# Patient Record
Sex: Male | Born: 1945 | Race: White | Hispanic: No | Marital: Married | Smoking: Never smoker
Health system: Southern US, Community
[De-identification: ages and names within clinical notes are randomized; demographics above are authoritative.]

## PROBLEM LIST (undated history)

## (undated) DIAGNOSIS — E785 Hyperlipidemia, unspecified: Secondary | ICD-10-CM

## (undated) DIAGNOSIS — G8929 Other chronic pain: Secondary | ICD-10-CM

## (undated) DIAGNOSIS — I10 Essential (primary) hypertension: Secondary | ICD-10-CM

## (undated) HISTORY — PX: BACK SURGERY: SHX140

## (undated) HISTORY — PX: CARDIAC CATHETERIZATION: SHX172

---

## 1998-03-10 ENCOUNTER — Ambulatory Visit (HOSPITAL_COMMUNITY): Admission: RE | Admit: 1998-03-10 | Discharge: 1998-03-10 | Payer: Self-pay | Admitting: Urology

## 2000-03-10 ENCOUNTER — Inpatient Hospital Stay (HOSPITAL_COMMUNITY): Admission: EM | Admit: 2000-03-10 | Discharge: 2000-03-13 | Payer: Self-pay | Admitting: Emergency Medicine

## 2000-03-10 ENCOUNTER — Encounter: Payer: Self-pay | Admitting: Orthopedic Surgery

## 2000-03-10 ENCOUNTER — Encounter: Payer: Self-pay | Admitting: Emergency Medicine

## 2000-06-23 ENCOUNTER — Encounter: Payer: Self-pay | Admitting: Specialist

## 2000-06-28 ENCOUNTER — Encounter (INDEPENDENT_AMBULATORY_CARE_PROVIDER_SITE_OTHER): Payer: Self-pay | Admitting: *Deleted

## 2000-06-28 ENCOUNTER — Inpatient Hospital Stay (HOSPITAL_COMMUNITY): Admission: RE | Admit: 2000-06-28 | Discharge: 2000-07-03 | Payer: Self-pay | Admitting: Specialist

## 2000-06-28 ENCOUNTER — Encounter: Payer: Self-pay | Admitting: Specialist

## 2004-10-16 ENCOUNTER — Emergency Department (HOSPITAL_COMMUNITY): Admission: EM | Admit: 2004-10-16 | Discharge: 2004-10-16 | Payer: Self-pay | Admitting: Emergency Medicine

## 2008-03-21 ENCOUNTER — Emergency Department (HOSPITAL_COMMUNITY): Admission: EM | Admit: 2008-03-21 | Discharge: 2008-03-21 | Payer: Self-pay | Admitting: Emergency Medicine

## 2008-04-05 ENCOUNTER — Ambulatory Visit (HOSPITAL_COMMUNITY): Admission: RE | Admit: 2008-04-05 | Discharge: 2008-04-05 | Payer: Self-pay | Admitting: Orthopedic Surgery

## 2008-04-13 ENCOUNTER — Ambulatory Visit (HOSPITAL_COMMUNITY): Admission: RE | Admit: 2008-04-13 | Discharge: 2008-04-13 | Payer: Self-pay | Admitting: Orthopedic Surgery

## 2010-05-05 ENCOUNTER — Ambulatory Visit
Admission: RE | Admit: 2010-05-05 | Discharge: 2010-05-05 | Payer: Self-pay | Source: Home / Self Care | Attending: Urology | Admitting: Urology

## 2010-07-13 LAB — POCT HEMOGLOBIN-HEMACUE: Hemoglobin: 14.1 g/dL (ref 13.0–17.0)

## 2010-09-18 NOTE — Discharge Summary (Signed)
[place]  Patient:    Roger [name], Roger [name]                    MRN: [id number] Adm. Date:  [id number] Disc. Date: [id number] Attending:  Aldean Baker V                           Discharge Summary  DIAGNOSIS:  Low back pain.  PROCEDURES:  None.  TREATMENT:  Pain control.  DISPOSITION:  The patient was discharged to home in stable condition.  LABORATORY STUDIES:  Plain radiographs and MRI scan.  MRI scan findings negative for recurrent HNP.  HISTORY OF PRESENT ILLNESS:  The patient is a 65 year old gentleman who had an acute onset of pain after picking up a box on March 09, 2000.  He is status post a lumbar spine discectomy x 2 and cervical spine anterior cervical discectomy and fusion x 1.  The patient, on initial evaluation, had a normal physical examination with positive sciatic stretch test.  CT scan showed what was felt to be an HNP at L4-5.  HOSPITAL COURSE:  He was admitted with pain control medication.  The patient underwent an MRI scan which did not show a ruptured disc.  The patient was feeling better on March 11, 2000.  Physical examination was within normal limits. He was discharged on March 12, 2000, in stable condition.  All motor strength was intact.  FOLLOW-UP:  He will follow up in the office in one to two weeks. DD:  03/29/00 TD:  03/29/00 Job: [id number]

## 2010-09-18 NOTE — Discharge Summary (Signed)
[place]. [facility]  Patient:    Roger [name], Roger [name]                    MRN: [id number] Adm. Date:  [id number] Disc. Date: [id number] Attending:  Lubertha South Dictator:   Ralene Bathe, P.A.                           Discharge Summary  ADMISSION DIAGNOSES: 1. 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 2. Hypercholesterolemia.  DISCHARGE DIAGNOSES: 1. 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 2. Hypercholesterolemia. 3. Status post posterolateral inner disc fusion at L4-L5 and microdiskectomy    with Allograft and local bone graft at right iliac crest utilizing rod    instrumentation and pedicle screws. 4. Intraoperative anemia utilizing cell saver for two units of autologous    blood donation.  PROCEDURE:  Posterior lumbar inner body fusion L4-L5 with 2 x 13 mm Daul Allograft and local bone graft with posterolateral fusion at L4-L5 with right iliac bone graft harvest with Synthes pedicle screws and rods system.  Surgeon Dr. Otelia Sergeant and assistant Dr. Simonne Come under general anesthesia.  HISTORY OF PRESENT ILLNESS:  Roger [name] is a well-known patient to our practice.  He has been followed some time for his back problems.  He has had two previous lumbar laminectomy surgeries by Dr. Wyonia Hough reportedly injured his back in November 2001, from lifting.  He had recurrent disc protrusion at anterior foramen at left side at L4-L5 and foraminal stenosis with severe degenerative disc changes at this level.  He also had apparent disc degeneration at L5-S1 which was minimal.  There was no evidence of nerve compression at this level.  With these indications and his physical findings, he is felt to require one-level posterolateral inner body fusion  with microdiskectomy.  Risks and benefits have been discussed with the patient.  He is in agreement and wishes to proceed.  HOSPITAL COURSE:  The patient was admitted and underwent above-mentioned procedure and tolerated this well.  All appropriate antibiotics and analgesics were provided.  He did receive two units of autologous blood intraoperatively from cell saver.  Postoperatively, the patient was placed on PCA for pain control as well as OxyContin and sustained release and intermediate release. He was also placed on bowel precautions.  Postoperatively, the patient had a significant amount of pain control as suspected with this surgery.  He was able to be weaned to p.o. analgesics.  His bowel function was slow to resume, however, he did begin passing flatus and was able to advance to a normal diet. By July 03, 2000, he was doing much better.  His pain was controlled.  He was ambulating distances greater than 100 feet in the hall.  He was tolerating his brace and able to doff and don without difficulty.  He was neurovascularly intact.  At this time, he was felt medically and orthopedically stable for discharge.  His most recent hemoglobin was 9.5 and stable.  He had had a bowel movement and was voiding without difficulty.  Temperatures were afebrile.  On July 03, 2000, he was stable for discharge to home.  LABORATORY DATA AND X-RAY FINDINGS:  Intraoperative and postoperative lumbar films show posterolateral fusion at L4-L5.  Hemoglobin on admission 10.4, he was 7.0 intraoperatively and postoperatively stable at 10.9, 10.0 and 9.5. Coagulation time normal on admission.  Routine chemistries on admission and postoperatively normal.  Urinalysis normal.  Chest x-ray not found at the time of dictation, nor EKG.  CONDITION ON DISCHARGE:  Stable and improved.  DISPOSITION:  Discharged to home.  FOLLOWUP:  He will follow up in our office in about seven days.  Call for a time.  DISCHARGE  MEDICATIONS: 1. OxyContin SR 20 mg b.i.d., #14. 2. OxyContin IR 5 mg, #41, q.4-6h. p.r.n. breakthrough pain. 3. Robaxin 500 mg, #31, every eight hours p.r.n. spasm, one refill. 4. Resume home medications.  DIET:  Resume home diet.  SPECIAL INSTRUCTIONS:  Wear brace when out of bed.  May shower in two to three days.  WOUND CARE:  Keep incision clean and dry. DD:  07/27/00 TD:  07/28/00 Job: [id number]

## 2010-09-18 NOTE — Op Note (Signed)
[place]. [facility]  Patient:    Roger [name], Roger [name]                    MRN: [id number] Proc. Date: 06/28/00 Adm. Date:  [id number] Attending:  Lubertha South                           Operative Report  PREOPERATIVE DIAGNOSES: 1. Lateral recurrent disk protrusion left L4-5. (This is this patients third    recurrent disk protrusion at this segment). 2. Minimal degenerative disk disease at L5-S1.  POSTOPERATIVE DIAGNOSES: 1. Lateral recurrent disk protrusion left L4-5. (This is this patients third    recurrent disk protrusion at this segment). 2. Minimal degenerative disk disease at L5-S1. 3. 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  OPERATIVE PROCEDURE: 1. Posterior lumbar interbody fusion technique using a Dall    allograft two 13 mm x 4 mm grafts and local bone graft to    the inner space. 2. Posterolateral fusion L4-5 with right iliac crest bone graft    harvested through separate fascial incision. 3. Synthes click pedicle screw and rod system using 85 mm prebent    rod divided into two portions and four 40 mm x 7 mm screws.  SURGEON:  [name], M.D.  ASSISTANT:  Illene Labrador. [place], M.D.  ANESTHESIA:  General endotracheal by [name]. [name], M.D.  ESTIMATED BLOOD LOSS:  800 cc.  DRAINS/TUBES:  Hemovac x 1, Foley catheter to straight drain.  INTRAVENOUS FLUIDS:  2 units of autogenous blood in operating room.  SPECIMENS:  Surgical specimen was disk material for gross specimen only.  INDICATIONS:  This patient is a 65 year old male. He has undergone two pervious lumbar laminectomy surgeries by Dr. [name]. [place] in the past. He reportedly injured his back in November of this past year lifting. Studies have indicated recurrent disk protrusion into the foramen on the left side at L4-5 with foraminal stenosis and severe degenerative disk  changes at this segment. He also has apparent disk degeneration at L5-S1 which is minimal. No evidence of nerve compression at the L5-S1 level, severe foraminal entrapment along the left side at L4-5 by apparent disk material and some spondylosis changes.  INTRAOPERATIVE FINDINGS:  The patient was found to have severe entrapment of the left L4 nerve root related to spur off the posterior aspect of the disk space at L4-5 as well as disk space collapse causing severe foraminal narrowing and overlying spondylosis of the left L4-5 facet. Disk herniation was also present into the left lateral recess effecting both the L4 and left L5 nerve root.  DESCRIPTION OF PROCEDURE:  After adequate general anesthesia with the patient in a prone position, all pressure points were well padded, chest rolls were used.  He was given standard preoperative antibiotics. He was draped in the usual manner following prep with DuraPrep solution, and after draping in the usual manner, an Ioban impregnated Vi-Drape was placed.  The incision ellipsing the old incision scar extending from S1 to L3 in the midline. The incision brought sharply through skin and subcutaneous layers after infiltration with Marcaine 0.50% with 1:200,000 epinephrine, approximately 20 cc were used. The patients lumbodorsal fascia was then incised on both sides of the spinous processes extending from L3 to S1. Three Cobbs were  then used to elevate the paralumbar muscles off of the posterior elements bilaterally at the L3, L4 and L5 levels. Clamps were placed over the spinous process of L4 and L5 and intraoperative radiograph demonstrated the clamps on L4 and L5. McCullough retractors were inserted; bleeders controlled using electrocautery. The spinous process of L4 and L5 were marked appropriately for continued identification. A Leksell rongeur was then used to excise the spinous process of L4 and the very superior aspect spinous process of  L5; this was resected down to the base of the posterior aspect of the lamina and the lamina was then thinned using Leksell rongeur. A 3 mm Kerrison was then introduced in the midline underneath the lamina of L4 and used to perform a central laminectomy of L4 removing the lamina bilaterally, preserving the pars region, and performing decompression of the L4 nerve roots bilaterally and L5 nerve roots bilaterally with foraminotomy of the L5 nerve roots. The medial aspect of the facet on the right side was resected over 50% and on the left almost 75% of the facet was resected in order to decompress the L4 nerve root. Excising the reflected portion of the ligamentum flavum, the nerve root was quite pinched, associated with a spur off the posterior aspect of the disk space at this level on the left side. Carefully, the thecal sac and left L4 nerve root and L5 nerve root were freed up using Penfield #4, carefully mobilizing the sac medially, dividing scar tissue within the axillary portion of the left L4 nerve root, and excising hypertrophic ligamentum flavum that had attached itself here. Bipolar electrocautery was used to control small bleeders and small veins.  The disk space on the left side was carefully freed up. The #15 blade scalpel was used to incise the disk carefully protecting the thecal sac and L4 nerve root with DEerrico retractor. After incising the disk, the disk space was found to be narrow, the end plates were debrided posteriorly using an end-cutting osteophyte rongeur. First a narrow pituitary and then a pituitary with teeth was able to be introduced into the intervertebral disk space and the disk space debrided of disk along the left side, removing all of the disk on this left side as much as possible as this was a generous debridement.  When this was completed and the thecal sac and nerve root freed up enough to allow for a Dall graft to be placed, the right sided portion of  the central laminectomy was then carefully exposed. A foraminotomy performed over the right L5 nerve root and over the right L4 nerve root. The thecal sac was then  mobilized medially to the midline so that the disk was exposed on the right side; bleeders controlled using bipolar electrocautery. A #15 blade scalpel was then used incise and excise a window of disk material on the right side posteriorly so that a pituitary rongeur could be inserted and the disk excised at the L4-5 level on the right side, as well as that on the left. With as much disk material as could be removed was removed, a sound was introduced into the right side. Using a 9 mm sound, the disk space was dilated, first to 9 mm, then 11 mm and then 13 mm on the right. A 13 mm graft appeared to give the best fit so this was chosen. A 13 mm x 4 mm Dall graft was brought onto the field to be sewn in place. The end plates were then carefully debrided  of cartilaginous material using the wooden handle and long curet, as well as the box osteotome, debriding away end plate of cartilaginous material and disk attachment. When all this had been debrided as much as possible on the right side, it was also carried out on the left side. Note, that retraction was only on one side at a time with care taken not to over-retract the thecal sac to the opposite level. With the disk space prepared, the left side was also sounded up to a 13 mm graft. The sounds were removed. The 13 mm x 4 mm Dall graft was introduced on the right side and impacted into place under C-arm fluoroscopy and observed to be in excellent position and alignment. This was counter sunk approximately 4-5 mm on the right side and on the left side approximately 3-4 mm.  Irrigation was performed. The graft was then impacted on the left side when it was ensured that there was no remaining disk material within the vertebral disk space. Once the graft was impacted on the left  side, bone had been harvested from the posterior elements of the facets and lamina. It was then placed in the space between the two Dall grafts centrally and impacted into place to provide for bone grafting between the allograft and bone graft sites. When this was completed, then careful inspection was made of the nerve roots. A hockey nerve probe could be passed out the neuroforamen of L4 bilaterally and L5 bilaterally demonstrating no further compression of the nerve roots here. Additionally, a hockey stick was passed over the posterior aspect of the intervertebral disk space and demonstrated no bone graft within the spinal canal or retropulse within the canal causing any canal compromise. With this then attention was turned to placement of pedicle screws. The posterior and lateral gutters were then developed over the transverse process of L4 and L5. Using cautery in the soft tissue over the lateral aspect of the facet at L3-4 identifying the L4 transverse process and then carefully stripping it of soft tissue attachment and debriding muscle where appropriate. Bleeders controlled using electrocautery. The facet at the L3-4 level was preserved. At the L4-5 level the transverse process was identified bilaterally. On the right side, the facet had overgrown laterally, quite a bit overlapping the transverse process and required resection of a portion of the lateral aspect of the superior articular process. This allowed for exposure of the transverse process here. The same was done on the left side. Following this then, the transverse process of L4 and L5 bilaterally were completely exposed along with the lateral aspect of the superior articular process of L4 and L5. Bleeders were controlled using electrocautery. The entry point for the pedicle on the left side at L4-5 was first performed using an awl into the midportion intersection of the transverse process of the superior articular process.  This was at the base of the superior articular process at its intersection with the transverse process. The larger pedicle finder was introduced and used to find the pedicle into the vertebral body. C-arm was used to ascertain the correct position and alignment of this penetration of the pedicle. Tapping was then performed using a 6.25 tap after measuring the appropriate length for the screw at 40 mm. A ball-tipped sounder was used to probe the pedicle hole and demonstrated no evidence of fracture of the pedicle, medial, lateral superior or inferior. The 7.0 screw was then introduced and screwed into place. C-arm fluoroscopy was used and was observed  to be well placed within the pedicle on the lateral view. Convergence was approximately 20-25 degrees. Similarly, a lead screw was then placed on the left side at the L5 level. This again was done using the awl and then the pedicle finder gear shift, and then gauging the length at 40 mm using a ball-tipped sounder to ensure that the pedicle was not breached, then tapping the 6.25 and then applying the appropriate 40 mm screw into this pedicle. Both levels obtained excellent purchase. C-arm fluoroscopy demonstrated the screws to be in good position and alignment. A counter sink was then used to clear the bone over the screwheads bilaterally on the left side and right side and then with placement of the right sided screws. Similarly, screws were placed on the right side at L4 and at L5 converging each at about 20 degrees and placing them using C-arm fluoroscopy, sounding each of them appropriately with the ball-tipped sounder, tapping with 6.5 tap and then placing the 7.0 screw at 40 mm each. Prebent 85 mm rod was used to affix the levels; this was done after decorticating the transverse process at L4 and L5 bilaterally. Bone graft was harvested from the right iliac crest through a separate fascial incision at this point, incising between the  fatty planes in the right lumbodorsal fascia out to the right iliac crest and the posterior superior iliac spine. Incision then made onto the posterior iliac crest and the posterior superior iliac spine and extended laterally. Cobbs were then used to elevate the periosteum off the medial and lateral aspect of the iliac crest posteriorly and self-retaining retractors inserted. Osteotomes were then used to remove corticocancellous strips of bone  graft and gouges used to remove inner corticocancellous strips of bone graft, enough for a single-level fusion. Bone graft harvest site was then irrigated and bone wax applied to the bleeding cancellous bone surfaces, then Gelfoam and then closed using interrupted #1 Vicryl sutures approximating the lumbodorsal fascia and the gluteal fascia over the defect here. Bone graft was then placed out over the transverse processes extending from L4 to L5, cancellous bone graft within the inner space and also both transverse processes superiorly and inferiorly. After this was placed the corticocancellous strips were then placed bridging the transverse process of L4 to L5 bilaterally.  When this was completed, each of the click-on fasteners were then placed over the screwheads at each segment and these were placed without difficulty. Rods were then placed and the caps then screwed into place using the counter torque device appropriately over all four levels. The most superior screws were then tightened, again using the counter torque device on both left and right sides. Using the compressor then, compression was obtained between the two pedicle screws on the left side at L4-5 and the lower screw then tightened into place. A cap was then tightened in fastening the rod to the pedicle screw. Similarly using compression device, then the right side pedicle screw was connected to the rod. This obtained compression across the graft anteriorly with pedicle screws. Care  was taken then to insert a hockey stick probe to ensure that the patient had adequate decompression of both L4 and L5 nerve roots continued.  Irrigation was performed with care taken not to remove bone graft. Excess Gelfoam was then removed and thrombin-soaked Gelfoam placed over the laminotomy defect posteriorly. A Hemovac drain exiting out the left lower back through the soft tissues placed here about the S1 level. The lumbodorsal fascia was then  reapproximated in the midline after a single suture approximated the soft tissues at the L4 level. The lumbodorsal fascia was approximated in the midline with interrupted figure-of-eight simple sutures of #1 Vicryl, the deep subcutaneous layers approximated with interrupted #1 and #0 Vicryl sutures, the more superficial layers with interrupted 2-0 Vicryl sutures and the skin closed with a running stitch of 4-0 Monocryl. Tincture of Benzoin and Steri-Strips was applied, 4 x 4s, and ABD pad affixed to the skin with Hypafix tape. The patient was returned to the supine position, extubated, reactivated and returned to the recovery room in satisfactory condition.  All sponge, needle, lap and instrument counts were correct. DD:  06/28/00 TD:  06/29/00 Job: [id number]

## 2011-02-02 ENCOUNTER — Ambulatory Visit (HOSPITAL_BASED_OUTPATIENT_CLINIC_OR_DEPARTMENT_OTHER)
Admission: RE | Admit: 2011-02-02 | Discharge: 2011-02-02 | Disposition: A | Discharge status: Home / Self Care | Payer: Medicare Other | Source: Ambulatory Visit | Attending: Urology | Admitting: Urology

## 2011-02-02 ENCOUNTER — Ambulatory Visit (HOSPITAL_BASED_OUTPATIENT_CLINIC_OR_DEPARTMENT_OTHER): Admission: RE | Admit: 2011-02-02 | Payer: Medicare Other | Source: Ambulatory Visit | Admitting: Urology

## 2011-02-02 DIAGNOSIS — L940 Localized scleroderma [morphea]: Secondary | ICD-10-CM | POA: Insufficient documentation

## 2011-02-02 DIAGNOSIS — N3289 Other specified disorders of bladder: Secondary | ICD-10-CM | POA: Insufficient documentation

## 2011-02-02 DIAGNOSIS — I10 Essential (primary) hypertension: Secondary | ICD-10-CM | POA: Insufficient documentation

## 2011-02-02 DIAGNOSIS — N35919 Unspecified urethral stricture, male, unspecified site: Secondary | ICD-10-CM | POA: Insufficient documentation

## 2011-02-05 NOTE — Op Note (Signed)
  NAME:  [name], Roger NO.:  [id number]  MEDICAL RECORD NO.:  [id number]  LOCATION:                               FACILITY:  [facility]  PHYSICIAN:  Natalia Leatherwood, MD    DATE OF BIRTH:  15-May-1945  DATE OF PROCEDURE:  02/02/2011 DATE OF DISCHARGE:                              OPERATIVE REPORT   SURGEON:  Natalia Leatherwood, MD.  PREOPERATIVE DIAGNOSIS:  Lichen sclerosus with meatal stenosis.  POSTOPERATIVE DIAGNOSIS:  Lichen sclerosus with meatal stenosis.  PROCEDURE PERFORMED:  Cystourethroscopy with meatal dilation and Foley catheter placement.  FINDINGS:  Meatal stenosis.  No strictures throughout the rest of the urethra.  Trabeculated bladder.  BLOOD LOSS:  None.  COMPLICATIONS:  None.  DRAINS:  Foley catheter.  SPECIMEN:  None.  HISTORY OF PRESENT ILLNESS:  This is a 65 year old male who has a longstanding problem with Lichen sclerosus and meatal stenosis.  He presented to my clinic with an almost pinpoint urethral meatus with difficulty voiding.  We discussed options and I recommended dilation in the operating room, which the patient agreed with.  I did explain to the patient prior to going to the operating room that this is a temporary procedure and that the scar will likely return.  The patient voiced understanding of this.  We had discussed the risks and benefits of the procedure.  PROCEDURE:  After informed consent was obtained, the patient was taken to the operating room where he was placed in supine position, IV antibiotics were infused, and general anesthesia was induced.  He was then placed in a dorsal lithotomy position where his genitals were prepped and draped in usual sterile fashion.  Following this, a [employer] sounds were used with water-based lubricant to dilate from beginning with 8-French up to 26-French at the urethral meatus.  After this was done, a rigid cystoscope was passed through the urethra and into the bladder.  The  bladder was noted to be trabeculated.  There were no strictures throughout the rest of the urethra.  All of the scar tissue was at the tip of the urethra.  After this, dilation was carried on up to 36-French with a [employer] sounds.  Following that, 10 cc of sterile lidocaine jelly were placed into the urethra and then a 26- French Foley catheter was placed into the bladder with 10 cc sterile water placed into the balloon.  Bacitracin antibiotic ointment was placed around the tip of the penis.  This concluded the procedure.  He was placed back in a supine position.  Anesthesia was reversed and he was taken to PACU in stable condition.  He will follow up with me and remove his catheter in approximately 10 days.          ______________________________ Natalia Leatherwood, MD     DW/MEDQ  D:  02/02/2011  T:  02/02/2011  Job:  [id number]  Electronically Signed by Natalia Leatherwood MD on 02/05/2011 08:44:02 PM

## 2011-02-15 DIAGNOSIS — R195 Other fecal abnormalities: Secondary | ICD-10-CM | POA: Insufficient documentation

## 2011-08-06 DIAGNOSIS — R030 Elevated blood-pressure reading, without diagnosis of hypertension: Secondary | ICD-10-CM | POA: Insufficient documentation

## 2012-04-17 ENCOUNTER — Emergency Department (HOSPITAL_COMMUNITY): Payer: Medicare Other

## 2012-04-17 ENCOUNTER — Encounter (HOSPITAL_COMMUNITY): Payer: Self-pay | Admitting: Emergency Medicine

## 2012-04-17 ENCOUNTER — Observation Stay (HOSPITAL_COMMUNITY): Payer: Medicare Other

## 2012-04-17 ENCOUNTER — Observation Stay (HOSPITAL_COMMUNITY)
Admission: EM | Admit: 2012-04-17 | Discharge: 2012-04-19 | Disposition: A | Discharge status: Home / Self Care | Payer: Medicare Other | Attending: Orthopedic Surgery | Admitting: Orthopedic Surgery

## 2012-04-17 DIAGNOSIS — S92009A Unspecified fracture of unspecified calcaneus, initial encounter for closed fracture: Principal | ICD-10-CM | POA: Insufficient documentation

## 2012-04-17 DIAGNOSIS — W11XXXA Fall on and from ladder, initial encounter: Secondary | ICD-10-CM | POA: Insufficient documentation

## 2012-04-17 MED ORDER — HYDROMORPHONE HCL PF 1 MG/ML IJ SOLN
1.0000 mg | Freq: Once | INTRAMUSCULAR | Status: AC
  Administered 2012-04-17: 1 mg via INTRAVENOUS
  Filled 2012-04-17: qty 1

## 2012-04-17 MED ORDER — SODIUM CHLORIDE 0.9 % IV SOLN
INTRAVENOUS | Status: DC
  Administered 2012-04-18: 20 mL/h via INTRAVENOUS

## 2012-04-17 MED ORDER — METHOCARBAMOL 500 MG PO TABS
500.0000 mg | ORAL_TABLET | Freq: Four times a day (QID) | ORAL | Status: DC | PRN
  Administered 2012-04-18 – 2012-04-19 (×2): 500 mg via ORAL
  Filled 2012-04-17 (×3): qty 1

## 2012-04-17 MED ORDER — MORPHINE SULFATE 4 MG/ML IJ SOLN
4.0000 mg | INTRAMUSCULAR | Status: DC | PRN
  Administered 2012-04-17 – 2012-04-18 (×3): 4 mg via INTRAVENOUS
  Filled 2012-04-17 (×3): qty 1

## 2012-04-17 MED ORDER — DOCUSATE SODIUM 100 MG PO CAPS
100.0000 mg | ORAL_CAPSULE | Freq: Two times a day (BID) | ORAL | Status: DC
  Administered 2012-04-17 – 2012-04-19 (×4): 100 mg via ORAL
  Filled 2012-04-17 (×5): qty 1

## 2012-04-17 MED ORDER — HYDROCODONE-ACETAMINOPHEN 5-325 MG PO TABS
2.0000 | ORAL_TABLET | Freq: Once | ORAL | Status: AC
  Administered 2012-04-17: 2 via ORAL
  Filled 2012-04-17: qty 2

## 2012-04-17 MED ORDER — METHOCARBAMOL 100 MG/ML IJ SOLN
500.0000 mg | Freq: Four times a day (QID) | INTRAVENOUS | Status: DC | PRN
  Filled 2012-04-17: qty 5

## 2012-04-17 MED ORDER — HYDROCODONE-ACETAMINOPHEN 5-325 MG PO TABS
2.0000 | ORAL_TABLET | Freq: Four times a day (QID) | ORAL | Status: DC | PRN
  Administered 2012-04-17 – 2012-04-18 (×3): 2 via ORAL
  Filled 2012-04-17 (×3): qty 2

## 2012-04-17 NOTE — ED Notes (Signed)
Patient transported to X-ray 

## 2012-04-17 NOTE — ED Provider Notes (Signed)
History     CSN: [id number]  Arrival date & time 04/17/12  1209   First MD Initiated Contact with Patient 04/17/12 1251      No chief complaint on file.  Chief complaint right ankle and foot pain (Consider location/radiation/quality/duration/timing/severity/associated sxs/prior treatment) HPI Patient fell off a ladder from a height approximately 7 feet at 10 AM today injuring his right foot and ankle no other injury no treatment prior to coming here pain is worse with weightbearing or palpation. Improved with remaining still History reviewed. No pertinent past medical history. Medical history is negative Past Surgical History  Procedure Date  . Back surgery     No family history on file.  History  Substance Use Topics  . Smoking status: Never Smoker   . Smokeless tobacco: Not on file  . Alcohol Use: No      Review of Systems  Constitutional: Negative.   HENT: Negative.   Respiratory: Negative.   Cardiovascular: Negative.   Gastrointestinal: Negative.   Musculoskeletal: Positive for arthralgias.  Skin: Negative.   Neurological: Negative.   Hematological: Negative.   Psychiatric/Behavioral: Negative.   All other systems reviewed and are negative.    Allergies  Review of patient's allergies indicates not on file.  Home Medications   Current Outpatient Rx  Name  Route  Sig  Dispense  Refill  . IBUPROFEN 200 MG PO TABS   Oral   Take 600 mg by mouth every 6 (six) hours as needed. For pain.           BP 145/85  Pulse 78  Temp 97.9 F (36.6 C) (Oral)  Resp 22  SpO2 100%  Physical Exam  Nursing note and vitals reviewed. Constitutional: He appears well-developed and well-nourished.  HENT:  Head: Normocephalic and atraumatic.  Eyes: Conjunctivae normal are normal. Pupils are equal, round, and reactive to light.  Neck: Neck supple. No tracheal deviation present. No thyromegaly present.  Cardiovascular: Normal rate and regular rhythm.   No murmur  heard. Pulmonary/Chest: Effort normal and breath sounds normal.  Abdominal: Soft. Bowel sounds are normal. He exhibits no distension. There is no tenderness.  Musculoskeletal: Normal range of motion. He exhibits no edema and no tenderness.       Extremity swollen and tender at lateral ankle and lateral aspect of. Skin intact nontender at the proximal fibula DP pulse 2+ all other extremities no contusion abrasion or tenderness neurovascularly intact  Neurological: He is alert. Coordination normal.  Skin: Skin is warm and dry. No rash noted.  Psychiatric: He has a normal mood and affect.    ED Course  Procedures (including critical care time)  Labs Reviewed - No data to display No results found.   No diagnosis found. 2 PM pain improved intravenous hydromorphone ordered. CT scan of foot ordered. 3:10 PM pain improved after treatment with intravenous hydromorphone MDM  CDU pending CT result Diagnosis #1 closed fracture of right calcaneus #2 closed fracture right #3 fall        Doug Sou, MD 04/21/12 [id number]

## 2012-04-17 NOTE — ED Notes (Signed)
Ortho tech informed of short leg splint

## 2012-04-17 NOTE — ED Notes (Signed)
Pt to go to CDU 6 after CT.

## 2012-04-17 NOTE — Progress Notes (Signed)
XRAYS of the tibia and the knee reviewed and are negative for acute bony or significant ligamentous injury.

## 2012-04-17 NOTE — ED Notes (Signed)
Pt was on a ladder which slipped causing him to fall. Fell approximately 4-5 feet. Right ankle with deformity.

## 2012-04-17 NOTE — ED Notes (Signed)
Patient transported to CT 

## 2012-04-17 NOTE — Progress Notes (Signed)
Orthopedic Tech Progress Note Patient Details:  Roger [name] [facility] 03-May-1946 [id number]  Ortho Devices Type of Ortho Device: Ace wrap;Post (short leg) splint Ortho Device/Splint Location: (R) LE Ortho Device/Splint Interventions: Application   Jennye Moccasin 04/17/2012, 6:15 PM

## 2012-04-17 NOTE — H&P (Signed)
NEILAN [name] is an 66 y.o. male.   Chief Complaint: Right foot pain. HPI: 66 yo male s/p fall injuring the right LE.  Patient reports fall 6 feet from a ladder.  No LOC no other injuries, although the right knee now hurts.   History reviewed. No pertinent past medical history.  Past Surgical History  Procedure Date  . Back surgery     No family history on file. Social History:  reports that he has never smoked. He does not have any smokeless tobacco history on file. He reports that he does not drink alcohol. His drug history not on file.  Allergies: NKDA   (Not in a hospital admission)  No results found for this or any previous visit (from the past 48 hour(s)). Dg Ankle Complete Right  04/17/2012  *RADIOLOGY REPORT*  Clinical Data: Right foot pain.  Fall from ladder.  RIGHT ANKLE - COMPLETE 3+ VIEW  Comparison: None.  Findings: Extensive soft tissue swelling is present of the lateral malleolus.  There is a displaced lateral avulsion fracture of the distal lateral malleolus.  The ankle joint is intact.  There is no significant joint effusion.  A comminuted calcaneal fracture is present as well.  IMPRESSION: 1.  Comminuted calcaneal fracture. 2.  Pulsion fracture from the lateral malleolus.  Recommend CT of the foot for further detail.   Original Report Authenticated By: Marin Roberts, M.D.    Ct Foot Right Wo Contrast  04/17/2012  *RADIOLOGY REPORT*  Clinical Data: Status post fall.  Calcaneus fracture.  CT OF THE RIGHT FOOT WITHOUT CONTRAST  Technique:  Multidetector CT imaging was performed according to the standard protocol. Multiplanar CT image reconstructions were also generated.  Comparison: Plain films earlier this same date.  Findings: The patient has a fracture of the calcaneus involving the posterior facet.  There are two main fracture lines through the posterior facet, one lateral and one central.  A third fracture lines extends just into the sustentaculum.  The main  component of the fracture extends obliquely from the dorsal calcaneus just posterior to the subtalar joint in an inferior and anterior direction through the plantar surface of the calcaneus.  The articular surface of the posterior facet of the subtalar joint is inferiorly displaced approximately 1.1 cm. The fracture also extends distally into the periphery of the calcaneocuboid joint without displacement.  No other fracture is identified.  There is extensive hematoma formation about the ankle, particularly on the lateral side.  No tendon entrapment is identified.  IMPRESSION: Comminuted calcaneus fracture as described most in keeping with a Sanders type 3 AB injury.   Original Report Authenticated By: Holley Dexter, M.D.    Dg Foot Complete Right  04/17/2012  *RADIOLOGY REPORT*  Clinical Data: Fall.  Lateral foot pain.  RIGHT FOOT COMPLETE - 3+ VIEW  Comparison: Ankle films from the same day.  Findings: A comminuted calcaneal fracture is present.  A lateral malleolar fracture is present as well.  The ankle joint is located.  IMPRESSION:  1.  Comminuted calcaneal fracture. 2.  Lateral malleolar a gallstone fracture. 3.  Recommend CT of the hind foot for further evaluation.   Original Report Authenticated By: Marin Roberts, M.D.     ROS  Blood pressure 136/87, pulse 76, temperature 97.9 F (36.6 C), temperature source Oral, resp. rate 20, SpO2 96.00%. Physical Exam AAO, Moderate Distress, neck nontender with pain free ROM, Lumbar spine non-tender Bilateral UEs with no pain and no deformity, normal ROM, NVI Right  LE with swollen foot, skin intact, sensation diminished dorsally and plantar, 2 plus DP pulse, well perfused, min pain with PROM of the toes, decreased AROM of the toes. Left LE with pain free ROM throughout, NVI Assessment/Plan Right closed calcaneus fracture with displacement and severe swelling.  No evidence of spinal injury at this time.   Check for right knee or tib/fib injury -  films pending. Admit for pain control and swelling. Discussed with Dr Victorino Dike who will see as an outpatient after D/C from hospital tomorrow.  [name],STEVEN R 04/17/2012, 5:59 PM

## 2012-04-17 NOTE — ED Provider Notes (Signed)
Care assumed from Dr. Ethelda Chick. Patient moved to CDU for CT scan of calcaneus which is showing comminuted calcaneus fractures. Consulted Dr. Ranell Patrick with [facility] who evaluated patient and is concerned about compartment syndrome. He is consulting his foot/ankle specialist. Patient will be admitted to the hospital. Pain controlled at this time.   Trevor Mace, PA-C 04/17/12 1730

## 2012-04-17 NOTE — Consult Note (Signed)
Reason for Consult:Right Calcaneus fracture Referring Physician: EDP  Roger [name] is an 66 y.o. male.  HPI: 66 yo male who fell 6 feet from a ladder.  C/o immediate pain in the right heel.  Unable to bear weight.  Presents to [facility] ED for eval.  History reviewed. No pertinent past medical history.  Past Surgical History  Procedure Date  . Back surgery     No family history on file.  Social History:  reports that he has never smoked. He does not have any smokeless tobacco history on file. He reports that he does not drink alcohol. His drug history not on file.  Allergies: Not on File  Medications: I have reviewed the patient's current medications.  No results found for this or any previous visit (from the past 48 hour(s)).  Dg Ankle Complete Right  04/17/2012  *RADIOLOGY REPORT*  Clinical Data: Right foot pain.  Fall from ladder.  RIGHT ANKLE - COMPLETE 3+ VIEW  Comparison: None.  Findings: Extensive soft tissue swelling is present of the lateral malleolus.  There is a displaced lateral avulsion fracture of the distal lateral malleolus.  The ankle joint is intact.  There is no significant joint effusion.  A comminuted calcaneal fracture is present as well.  IMPRESSION: 1.  Comminuted calcaneal fracture. 2.  Pulsion fracture from the lateral malleolus.  Recommend CT of the foot for further detail.   Original Report Authenticated By: Marin Roberts, M.D.    Ct Foot Right Wo Contrast  04/17/2012  *RADIOLOGY REPORT*  Clinical Data: Status post fall.  Calcaneus fracture.  CT OF THE RIGHT FOOT WITHOUT CONTRAST  Technique:  Multidetector CT imaging was performed according to the standard protocol. Multiplanar CT image reconstructions were also generated.  Comparison: Plain films earlier this same date.  Findings: The patient has a fracture of the calcaneus involving the posterior facet.  There are two main fracture lines through the posterior facet, one lateral and one central.  A  third fracture lines extends just into the sustentaculum.  The main component of the fracture extends obliquely from the dorsal calcaneus just posterior to the subtalar joint in an inferior and anterior direction through the plantar surface of the calcaneus.  The articular surface of the posterior facet of the subtalar joint is inferiorly displaced approximately 1.1 cm. The fracture also extends distally into the periphery of the calcaneocuboid joint without displacement.  No other fracture is identified.  There is extensive hematoma formation about the ankle, particularly on the lateral side.  No tendon entrapment is identified.  IMPRESSION: Comminuted calcaneus fracture as described most in keeping with a Sanders type 3 AB injury.   Original Report Authenticated By: Holley Dexter, M.D.    Dg Foot Complete Right  04/17/2012  *RADIOLOGY REPORT*  Clinical Data: Fall.  Lateral foot pain.  RIGHT FOOT COMPLETE - 3+ VIEW  Comparison: Ankle films from the same day.  Findings: A comminuted calcaneal fracture is present.  A lateral malleolar fracture is present as well.  The ankle joint is located.  IMPRESSION:  1.  Comminuted calcaneal fracture. 2.  Lateral malleolar a gallstone fracture. 3.  Recommend CT of the hind foot for further evaluation.   Original Report Authenticated By: Marin Roberts, M.D.     ROS Blood pressure 136/87, pulse 76, temperature 97.9 F (36.6 C), temperature source Oral, resp. rate 20, SpO2 96.00%. Physical Exam  C-spine nontender with pain free ROM, L-spine non tender, Chest and Abdomen nontender to palpation.  Bilateral UE with no deformity and no pain with AROM.  5/5 motor, sensation normal, Right LE very swollen foot with numbness dorsally and decreased sensation on the plantar foot.  Well perfuse with brisk refill and normal DP pulse. Skin intact, tender at the lateral knee.  Left LE with pain free ROM.  Assessment/Plan: XRAYs needed of the tib/fub and the knee. Admit  for pain control and elevation.  Discussed the patient with Dr Victorino Dike who will follow up the patient as an outpatient.  Roger [name],STEVEN R 04/17/2012, 5:37 PM

## 2012-04-18 ENCOUNTER — Encounter (HOSPITAL_COMMUNITY): Payer: Self-pay | Admitting: *Deleted

## 2012-04-18 MED ORDER — ZOLPIDEM TARTRATE 5 MG PO TABS
10.0000 mg | ORAL_TABLET | Freq: Every evening | ORAL | Status: DC | PRN
  Administered 2012-04-18: 10 mg via ORAL
  Filled 2012-04-18: qty 2

## 2012-04-18 MED ORDER — OXYCODONE HCL 5 MG PO TABS
5.0000 mg | ORAL_TABLET | ORAL | Status: DC | PRN
  Administered 2012-04-19 (×2): 10 mg via ORAL
  Filled 2012-04-18 (×2): qty 2

## 2012-04-18 NOTE — Progress Notes (Signed)
Subjective: Pt admitted yesterday by Dr. Ranell Patrick after a fall from a ladder.  He has had moderate to severe pain in the left foot at the heel.  This is better with IV pain medicine and worse when up out of bed trying to ambulate.  PT was difficult due to pain but he was able to get around short distances with a walker.  He c/o muscle spasms in his quad muscle.  Objective: Vital signs in last 24 hours: Temp:  [97.8 F (36.6 C)-98.9 F (37.2 C)] 98.5 F (36.9 C) (12/17 1347) Pulse Rate:  [77-90] 90  (12/17 1347) Resp:  [14-18] 16  (12/17 1347) BP: (116-150)/(74-87) 136/87 mmHg (12/17 1347) SpO2:  [94 %-100 %] 100 % (12/17 1347)  Intake/Output from previous day: 12/16 0701 - 12/17 0700 In: 805 [P.O.:600; I.V.:205] Out: 1950 [Urine:1950] Intake/Output this shift: Total I/O In: 480 [P.O.:480] Out: 1500 [Urine:1500]  No results found for this basename: HGB:5 in the last 72 hours No results found for this basename: WBC:2,RBC:2,HCT:2,PLT:2 in the last 72 hours No results found for this basename: NA:2,K:2,CL:2,CO2:2,BUN:2,CREATININE:2,GLUCOSE:2,CALCIUM:2 in the last 72 hours No results found for this basename: LABPT:2,INR:2 in the last 72 hours  wn wd male in nad.  R LE splinted.  Flicker of PF and DF at toes.  Feels LT in medial plantar nerve dist but diminished in lateral plantar nerve dist.  brisk cap refill at toes.  NTTP at calf muscles.  Assessment/Plan: R calcaneus fracture - I explained the nature of the injury to the patient in detail.  He is appropriately immobilized at this point, and his pain seems to be improving.  WE'll observe him overnight again for pain control and elevation.  He'll have PT tomorrow and likely go home in the afternoon.  We discussed the treatment options in detail today including operative and nonoperative treatment.  We'll discuss again tomorrow.   Toni Arthurs 04/18/2012, 4:52 PM

## 2012-04-18 NOTE — Progress Notes (Signed)
Orthopedic Tech Progress Note Patient Details:  Roger [name] [birth year] [id number] Order for OHF has already been completed Patient ID: Roger [name], male   DOB: 03-08-1946, 66 y.o.   MRN: [id number]   [name] 04/18/2012, 10:08 AM

## 2012-04-18 NOTE — Evaluation (Signed)
Physical Therapy Evaluation Patient Details Name: Roger [name] MRN: [id number] DOB: May 30, 1945 Today's Date: 04/18/2012 Time: 8295-6213 PT Time Calculation (min): 36 min  PT Assessment / Plan / Recommendation Clinical Impression  Pt is a 66 y/o male Right closed calcaneus fracture with displacement and severe swelling.  Pt will be followed by acute PT to progress mobility for d/c to home.      PT Assessment  Patient needs continued PT services    Follow Up Recommendations  Home health PT;Supervision - Intermittent    Does the patient have the potential to tolerate intense rehabilitation      Barriers to Discharge        Equipment Recommendations       Recommendations for Other Services     Frequency Min 6X/week    Precautions / Restrictions Precautions Precautions: Fall Restrictions Weight Bearing Restrictions: Yes RLE Weight Bearing: Non weight bearing   Pertinent Vitals/Pain 6-8/10 pain in R foot.  Pt medicated prior to session.       Mobility  Bed Mobility Bed Mobility: Supine to Sit;Sitting - Scoot to Edge of Bed Supine to Sit: 6: Modified independent (Device/Increase time) Sitting - Scoot to Edge of Bed: 6: Modified independent (Device/Increase time) Sit to Supine: 6: Modified independent (Device/Increase time) Transfers Transfers: Sit to Stand;Stand to [employer] Transfers Sit to Stand: 4: Min guard Stand to Sit: 4: Min guard Stand Pivot Transfers: 4: Min guard Ambulation/Gait Ambulation/Gait Assistance: 4: Min guard Ambulation Distance (Feet): 15 Feet Assistive device: Crutches Ambulation/Gait Assistance Details: Pt a little unsteady and c/o increased pain with each step.   Gait Pattern: Step-to pattern Gait velocity: slow General Gait Details: NWB R LE Stairs: No Wheelchair Mobility Wheelchair Mobility: No    Shoulder Instructions     Exercises     PT Diagnosis: Difficulty walking;Acute pain  PT Problem List: Decreased  strength;Decreased range of motion;Decreased activity tolerance;Decreased mobility;Decreased knowledge of precautions;Impaired sensation;Pain PT Treatment Interventions: DME instruction;Gait training;Stair training;Functional mobility training;Therapeutic activities;Patient/family education   PT Goals Acute Rehab PT Goals PT Goal Formulation: With patient Time For Goal Achievement: 04/25/12 Potential to Achieve Goals: Good Pt will go Sit to Stand: with modified independence PT Goal: Sit to Stand - Progress: Goal set today Pt will go Stand to Sit: with modified independence PT Goal: Stand to Sit - Progress: Goal set today Pt will Transfer Bed to Chair/Chair to Bed: with modified independence PT Transfer Goal: Bed to Chair/Chair to Bed - Progress: Goal set today Pt will Ambulate: 16 - 50 feet;with modified independence;with least restrictive assistive device PT Goal: Ambulate - Progress: Goal set today Pt will Go Up / Down Stairs: 1-2 stairs;with supervision;with rolling walker PT Goal: Up/Down Stairs - Progress: Goal set today  Visit Information  Last PT Received On: 04/18/12    Subjective Data  Subjective: Agree to PT eval   Prior Functioning  Home Living Lives With: Family;Spouse Available Help at Discharge: Family;Available 24 hours/day Type of Home: House Home Access: Stairs to enter [employer] of Steps: 1 Entrance Stairs-Rails: None Home Layout: One level Bathroom Shower/Tub: Forensic scientist: Standard Bathroom Accessibility: Yes How Accessible: Accessible via walker Home Adaptive Equipment: Bedside commode/3-in-1;Walker - rolling;Crutches Prior Function Level of Independence: Independent Driving: Yes Vocation: Retired Musician: No difficulties    Cognition  Overall Cognitive Status: Appears within functional limits for tasks assessed/performed Arousal/Alertness: Awake/alert Orientation Level: Appears intact  for tasks assessed Behavior During Session: [facility] for tasks performed  Extremity/Trunk Assessment Right Upper Extremity Assessment RUE ROM/Strength/Tone: Within functional levels Left Upper Extremity Assessment LUE ROM/Strength/Tone: Within functional levels Right Lower Extremity Assessment RLE ROM/Strength/Tone: Unable to fully assess;Due to precautions;Due to pain RLE Sensation: Deficits RLE Sensation Deficits: Numbness and sluggish capillary refill in toes  Left Lower Extremity Assessment LLE ROM/Strength/Tone: Within functional levels   Balance Balance Balance Assessed: No  End of Session PT - End of Session Equipment Utilized During Treatment: Gait belt Activity Tolerance: Patient limited by fatigue;Patient limited by pain Patient left: in chair;with call bell/phone within reach Nurse Communication: Mobility status;Weight bearing status  GP Functional Assessment Tool Used: clinical judgement Functional Limitation: Mobility: Walking and moving around Mobility: Walking and Moving Around Current Status ([id number]): At least 20 percent but less than 40 percent impaired, limited or restricted Mobility: Walking and Moving Around Goal Status [phone]): At least 1 percent but less than 20 percent impaired, limited or restricted   Marsha Hillman 04/18/2012, 3:55 PM  Alta Shober L. Zi Newbury DPT [phone]

## 2012-04-19 MED ORDER — SENNOSIDES 8.6 MG PO TABS: 2.0000 | ORAL_TABLET | Freq: Every day | ORAL | Status: DC

## 2012-04-19 MED ORDER — METHOCARBAMOL 500 MG PO TABS: 500.0000 mg | ORAL_TABLET | Freq: Four times a day (QID) | ORAL | Status: DC | PRN

## 2012-04-19 MED ORDER — ASPIRIN EC 325 MG PO TBEC: 325.0000 mg | DELAYED_RELEASE_TABLET | Freq: Every day | ORAL | Status: DC

## 2012-04-19 MED ORDER — DOCUSATE SODIUM 100 MG PO CAPS: 100.0000 mg | ORAL_CAPSULE | Freq: Two times a day (BID) | ORAL | Status: DC

## 2012-04-19 MED ORDER — OXYCODONE HCL 5 MG PO TABS: 5.0000 mg | ORAL_TABLET | ORAL | Status: DC | PRN

## 2012-04-19 NOTE — Progress Notes (Signed)
UR COMPLETED  

## 2012-04-19 NOTE — Progress Notes (Signed)
[facility]  Patient Status: New  [facility] is providing the following services: PT and OT  If patient discharges after hours, please call [phone].   Roger [name] 04/19/2012, 12:22 PM

## 2012-04-19 NOTE — Care Management Note (Addendum)
    Page 1 of 2   04/19/2012     12:39:50 PM   CARE MANAGEMENT NOTE 04/19/2012  Patient:  Roger [name], Roger [name]   Account Number:  [id number]  Date Initiated:  04/19/2012  Documentation initiated by:  Letha Cape  Subjective/Objective Assessment:   dx right calcaneous fx  admit-lives with spouse.     Action/Plan:   pt eval- recs hhpt.   Anticipated DC Date:  04/19/2012   Anticipated DC Plan:  HOME W HOME HEALTH SERVICES      DC Planning Services  CM consult      [facility]  HOME HEALTH   Choice offered to / List presented to:  C-1 Patient   DME arranged  Levan Hurst      DME agency  [facility]     HH arranged  HH-2 PT      [facility] agency  [facility]   Status of service:  Completed, signed off Medicare Important Message given?   (If response is "NO", the following Medicare IM given date fields will be blank) Date Medicare IM given:   Date Additional Medicare IM given:    Discharge Disposition:  HOME W HOME HEALTH SERVICES  Per UR Regulation:  Reviewed for med. necessity/level of care/duration of stay  If discussed at Long Length of Stay Meetings, dates discussed:    Comments:  04/19/12 11:56 Letha Cape RN, BSN  [phone] patient lives with spouse, patient has medication coverage and transportation at dc. Per physical therapy patient will need hhpt, patient chose [facility] from agency list.  Referral given to [facility], Marie notified.  Soc will begin 24-48 hrs post discharge.  Patient states he needs a rolling walker and a 3  n 1, and would like to use [facility] , referral made to Darrin with [facility], he will bring rolling walker and 3  n 1 to room.

## 2012-04-19 NOTE — Discharge Summary (Signed)
Physician Discharge Summary  Patient ID: CYLE KENYON MRN: [id number] DOB/AGE: 64-Jul-1947 64 y.o.  Admit date: 04/17/2012 Discharge date: 04/19/2012  Admission Diagnoses:  Right Calcaneus fracture  Discharge Diagnoses:  Right calcaneus fracture  Discharged Condition: stable  Hospital Course: Pt was admitted on 12/16 after a fall from a ladder.  He was admitted for pain control due to a right calcaneus fracture.  He has made steady progress with his pain and is now taking only oral oxycodone.  He has done well with PT and can get around safely NWB.  Consults: None  Significant Diagnostic Studies: none  Treatments: therapies: PT  Discharge Exam: Blood pressure 135/82, pulse 102, temperature 98.7 F (37.1 C), temperature source Oral, resp. rate 19, SpO2 96.00%. wn wd male in nad.  a and O x 4.  mood and affect normal.  eomi.  respirations unlabored.  right foot splinted.  skin swollen.  decreased sens to lt in lateral plantar nerve dist.  active PF and DF at toes.  Disposition: 01-Home or Self Care  Discharge Orders    Future Orders Please Complete By Expires   Diet - low sodium heart healthy      Call MD / Call 911      Comments:   If you experience chest pain or shortness of breath, CALL 911 and be transported to the hospital emergency room.  If you develope a fever above 101 F, pus (white drainage) or increased drainage or redness at the wound, or calf pain, call your surgeon's office.   Constipation Prevention      Comments:   Drink plenty of fluids.  Prune juice may be helpful.  You may use a stool softener, such as Colace (over the counter) 100 mg twice a day.  Use MiraLax (over the counter) for constipation as needed.   Increase activity slowly as tolerated      Non weight bearing      Comments:   Broken heel.       Medication List     As of 04/19/2012  7:31 AM    STOP taking these medications         ibuprofen 200 MG tablet   Commonly known as:  ADVIL,MOTRIN      TAKE these medications         aspirin EC 325 MG tablet   Take 1 tablet (325 mg total) by mouth daily.      docusate sodium 100 MG capsule   Commonly known as: COLACE   Take 1 capsule (100 mg total) by mouth 2 (two) times daily. While taking narcotic pain medicine.      methocarbamol 500 MG tablet   Commonly known as: ROBAXIN   Take 1 tablet (500 mg total) by mouth every 6 (six) hours as needed (muscle spasm).      oxyCODONE 5 MG immediate release tablet   Commonly known as: Oxy IR/ROXICODONE   Take 1-2 tablets (5-10 mg total) by mouth every 4 (four) hours as needed for pain (moderate to severe pain).      senna 8.6 MG tablet   Commonly known as: SENOKOT   Take 2 tablets (17.2 mg total) by mouth daily. While taking narcotic pain medicine.           Follow-up Information    Follow up with Camielle Sizer, Jonny Ruiz, MD. Schedule an appointment as soon as possible for a visit in 1 week.   Contact information:   [address], [address]  [place] [address] [phone]          Signed: Toni Arthurs 04/19/2012, 7:31 AM

## 2012-04-19 NOTE — Progress Notes (Signed)
Physical Therapy Treatment Patient Details Name: Roger [name] MRN: [id number] DOB: 07/06/1945 Today's Date: 04/19/2012 Time: 4098-1191 PT Time Calculation (min): 17 min  PT Assessment / Plan / Recommendation Comments on Treatment Session  Pt has a RW at home and preferred using the walker to the crutches. Appropriate for d/c to home.     Follow Up Recommendations  Home health PT;Supervision - Intermittent     Does the patient have the potential to tolerate intense rehabilitation     Barriers to Discharge        Equipment Recommendations  None recommended by PT    Recommendations for Other Services    Frequency     Plan All goals met and education completed, patient dischaged from PT services    Precautions / Restrictions Precautions Precautions: Fall Restrictions Weight Bearing Restrictions: Yes RLE Weight Bearing: Non weight bearing   Pertinent Vitals/Pain 6/10 pain in foot. Pt medicated prior to session    Mobility  Bed Mobility Bed Mobility: Supine to Sit;Sitting - Scoot to Edge of Bed Supine to Sit: 6: Modified independent (Device/Increase time) Sitting - Scoot to Edge of Bed: 6: Modified independent (Device/Increase time) Sit to Supine: 6: Modified independent (Device/Increase time) Transfers Transfers: Sit to Stand;Stand to Sit;Stand Pivot Transfers Sit to Stand: 6: Modified independent (Device/Increase time) Stand to Sit: 6: Modified independent (Device/Increase time) Stand Pivot Transfers: 6: Modified independent (Device/Increase time) Ambulation/Gait Ambulation/Gait Assistance: 6: Modified independent (Device/Increase time) Ambulation Distance (Feet): 50 Feet Assistive device: Rolling walker Ambulation/Gait Assistance Details: Pt preferred RW to crutches.  Pt demonstrated safety and modified independence with RW.  Gait Pattern: Step-to pattern General Gait Details: NWB R LE Stairs: Yes Stairs Assistance: 5: Supervision Stairs Assistance Details  (indicate cue type and reason): Pt a little unsteady when advancing walker onto the step the first 2 trials but was able to maintain his balance better the final  3 attempts.  Stair Management Technique: Backwards;With walker Number of Stairs: 1     Exercises     PT Diagnosis:    PT Problem List:   PT Treatment Interventions:     PT Goals Acute Rehab PT Goals PT Goal Formulation: With patient Time For Goal Achievement: 04/25/12 Potential to Achieve Goals: Good Pt will go Sit to Stand: with modified independence PT Goal: Sit to Stand - Progress: Met Pt will go Stand to Sit: with modified independence PT Goal: Stand to Sit - Progress: Met Pt will Transfer Bed to Chair/Chair to Bed: with modified independence PT Transfer Goal: Bed to Chair/Chair to Bed - Progress: Met Pt will Ambulate: 16 - 50 feet;with modified independence;with least restrictive assistive device PT Goal: Ambulate - Progress: Met Pt will Go Up / Down Stairs: 1-2 stairs;with supervision;with rolling walker PT Goal: Up/Down Stairs - Progress: Met  Visit Information  Last PT Received On: 04/19/12 Assistance Needed: +1    Subjective Data      Cognition  Overall Cognitive Status: Appears within functional limits for tasks assessed/performed Arousal/Alertness: Awake/alert Orientation Level: Appears intact for tasks assessed Behavior During Session: [facility] for tasks performed    Balance     End of Session PT - End of Session Equipment Utilized During Treatment: Gait belt Activity Tolerance: Patient tolerated treatment well Patient left: in chair;with call bell/phone within reach Nurse Communication: Mobility status;Weight bearing status   GP Functional Assessment Tool Used: clinical judgement Functional Limitation: Mobility: Walking and moving around Mobility: Walking and Moving Around Current Status ([id number]): At least 1 percent  but less than 20 percent impaired, limited or restricted Mobility: Walking and  Moving Around Goal Status [phone]): At least 1 percent but less than 20 percent impaired, limited or restricted Mobility: Walking and Moving Around Discharge Status [phone]): At least 1 percent but less than 20 percent impaired, limited or restricted   Roger [name] 04/19/2012, 11:55 AM Roger [name] L. Roger [name] DPT [phone]

## 2012-04-21 NOTE — ED Provider Notes (Signed)
Medical screening examination/treatment/procedure(s) were conducted as a shared visit with non-physician practitioner(s) and myself.  I personally evaluated the patient during the encounter  Doug Sou, MD 04/21/12 [phone]

## 2017-12-15 DIAGNOSIS — E785 Hyperlipidemia, unspecified: Secondary | ICD-10-CM | POA: Insufficient documentation

## 2019-02-12 ENCOUNTER — Emergency Department (HOSPITAL_COMMUNITY): Payer: Medicare Other

## 2019-02-12 ENCOUNTER — Encounter (HOSPITAL_COMMUNITY): Payer: Self-pay | Admitting: Medical

## 2019-02-12 ENCOUNTER — Other Ambulatory Visit: Payer: Self-pay

## 2019-02-12 ENCOUNTER — Inpatient Hospital Stay (HOSPITAL_COMMUNITY)
Admission: EM | Admit: 2019-02-12 | Discharge: 2019-02-19 | DRG: 234 | Disposition: A | Discharge status: Home Health | Payer: Medicare Other | Attending: Cardiothoracic Surgery | Admitting: Cardiothoracic Surgery

## 2019-02-12 DIAGNOSIS — R0902 Hypoxemia: Secondary | ICD-10-CM | POA: Diagnosis present

## 2019-02-12 DIAGNOSIS — I2510 Atherosclerotic heart disease of native coronary artery without angina pectoris: Secondary | ICD-10-CM

## 2019-02-12 DIAGNOSIS — Z8249 Family history of ischemic heart disease and other diseases of the circulatory system: Secondary | ICD-10-CM

## 2019-02-12 DIAGNOSIS — J9811 Atelectasis: Secondary | ICD-10-CM | POA: Diagnosis not present

## 2019-02-12 DIAGNOSIS — I200 Unstable angina: Secondary | ICD-10-CM

## 2019-02-12 DIAGNOSIS — R0602 Shortness of breath: Secondary | ICD-10-CM | POA: Diagnosis present

## 2019-02-12 DIAGNOSIS — I272 Pulmonary hypertension, unspecified: Secondary | ICD-10-CM | POA: Diagnosis present

## 2019-02-12 DIAGNOSIS — Z20828 Contact with and (suspected) exposure to other viral communicable diseases: Secondary | ICD-10-CM | POA: Diagnosis present

## 2019-02-12 DIAGNOSIS — I129 Hypertensive chronic kidney disease with stage 1 through stage 4 chronic kidney disease, or unspecified chronic kidney disease: Secondary | ICD-10-CM | POA: Diagnosis present

## 2019-02-12 DIAGNOSIS — E785 Hyperlipidemia, unspecified: Secondary | ICD-10-CM | POA: Diagnosis present

## 2019-02-12 DIAGNOSIS — N183 Chronic kidney disease, stage 3 unspecified: Secondary | ICD-10-CM | POA: Diagnosis present

## 2019-02-12 DIAGNOSIS — D62 Acute posthemorrhagic anemia: Secondary | ICD-10-CM | POA: Diagnosis not present

## 2019-02-12 DIAGNOSIS — I700 Atherosclerosis of aorta: Secondary | ICD-10-CM | POA: Diagnosis present

## 2019-02-12 DIAGNOSIS — G8929 Other chronic pain: Secondary | ICD-10-CM | POA: Diagnosis present

## 2019-02-12 DIAGNOSIS — D7589 Other specified diseases of blood and blood-forming organs: Secondary | ICD-10-CM | POA: Diagnosis present

## 2019-02-12 DIAGNOSIS — I208 Other forms of angina pectoris: Secondary | ICD-10-CM | POA: Diagnosis not present

## 2019-02-12 DIAGNOSIS — I25110 Atherosclerotic heart disease of native coronary artery with unstable angina pectoris: Secondary | ICD-10-CM

## 2019-02-12 DIAGNOSIS — R079 Chest pain, unspecified: Secondary | ICD-10-CM | POA: Diagnosis not present

## 2019-02-12 DIAGNOSIS — R0682 Tachypnea, not elsewhere classified: Secondary | ICD-10-CM | POA: Diagnosis present

## 2019-02-12 DIAGNOSIS — Z23 Encounter for immunization: Secondary | ICD-10-CM | POA: Diagnosis not present

## 2019-02-12 DIAGNOSIS — Z7982 Long term (current) use of aspirin: Secondary | ICD-10-CM | POA: Diagnosis not present

## 2019-02-12 DIAGNOSIS — R0789 Other chest pain: Secondary | ICD-10-CM | POA: Diagnosis not present

## 2019-02-12 DIAGNOSIS — Z9889 Other specified postprocedural states: Secondary | ICD-10-CM

## 2019-02-12 DIAGNOSIS — Z951 Presence of aortocoronary bypass graft: Secondary | ICD-10-CM | POA: Diagnosis not present

## 2019-02-12 DIAGNOSIS — D696 Thrombocytopenia, unspecified: Secondary | ICD-10-CM | POA: Diagnosis present

## 2019-02-12 DIAGNOSIS — I10 Essential (primary) hypertension: Secondary | ICD-10-CM | POA: Diagnosis not present

## 2019-02-12 DIAGNOSIS — E876 Hypokalemia: Secondary | ICD-10-CM | POA: Diagnosis present

## 2019-02-12 DIAGNOSIS — Z79899 Other long term (current) drug therapy: Secondary | ICD-10-CM

## 2019-02-12 DIAGNOSIS — Z01810 Encounter for preprocedural cardiovascular examination: Secondary | ICD-10-CM | POA: Diagnosis not present

## 2019-02-12 DIAGNOSIS — I4891 Unspecified atrial fibrillation: Secondary | ICD-10-CM | POA: Diagnosis not present

## 2019-02-12 DIAGNOSIS — I480 Paroxysmal atrial fibrillation: Secondary | ICD-10-CM | POA: Diagnosis not present

## 2019-02-12 DIAGNOSIS — D631 Anemia in chronic kidney disease: Secondary | ICD-10-CM | POA: Diagnosis present

## 2019-02-12 HISTORY — DX: Hyperlipidemia, unspecified: E78.5

## 2019-02-12 HISTORY — DX: Other chronic pain: G89.29

## 2019-02-12 HISTORY — DX: Essential (primary) hypertension: I10

## 2019-02-12 LAB — SARS CORONAVIRUS 2 (TAT 6-24 HRS): SARS Coronavirus 2: NEGATIVE

## 2019-02-12 LAB — POCT I-STAT EG7
Bicarbonate: 22.3 mmol/L (ref 20.0–28.0)
Calcium, Ion: 1.09 mmol/L — ABNORMAL LOW (ref 1.15–1.40)
HCT: 41 % (ref 39.0–52.0)
Hemoglobin: 13.9 g/dL (ref 13.0–17.0)
O2 Saturation: 94 %
Potassium: 4.1 mmol/L (ref 3.5–5.1)
Sodium: 142 mmol/L (ref 135–145)
TCO2: 23 mmol/L (ref 22–32)
pCO2, Ven: 27.9 mmHg — ABNORMAL LOW (ref 44.0–60.0)
pH, Ven: 7.511 — ABNORMAL HIGH (ref 7.250–7.430)
pO2, Ven: 63 mmHg — ABNORMAL HIGH (ref 32.0–45.0)

## 2019-02-12 LAB — BASIC METABOLIC PANEL
Anion gap: 13 (ref 5–15)
BUN: 14 mg/dL (ref 8–23)
CO2: 21 mmol/L — ABNORMAL LOW (ref 22–32)
Calcium: 9.4 mg/dL (ref 8.9–10.3)
Chloride: 108 mmol/L (ref 98–111)
Creatinine, Ser: 1.25 mg/dL — ABNORMAL HIGH (ref 0.61–1.24)
GFR calc Af Amer: >60 mL/min (ref >60)
GFR calc non Af Amer: 58 mL/min — ABNORMAL LOW (ref >60)
Glucose, Bld: 104 mg/dL — ABNORMAL HIGH (ref 70–99)
Potassium: 4.1 mmol/L (ref 3.5–5.1)
Sodium: 142 mmol/L (ref 135–145)

## 2019-02-12 LAB — TROPONIN I (HIGH SENSITIVITY)
Troponin I (High Sensitivity): 35 ng/L — ABNORMAL HIGH
Troponin I (High Sensitivity): 75 ng/L — ABNORMAL HIGH
Troponin I (High Sensitivity): 103 ng/L (ref <18)

## 2019-02-12 LAB — CBC
HCT: 40.1 % (ref 39.0–52.0)
Hemoglobin: 14.2 g/dL (ref 13.0–17.0)
MCH: 36.3 pg — ABNORMAL HIGH (ref 26.0–34.0)
MCHC: 35.4 g/dL (ref 30.0–36.0)
MCV: 102.6 fL — ABNORMAL HIGH (ref 80.0–100.0)
Platelets: 258 10*3/uL (ref 150–400)
RBC: 3.91 MIL/uL — ABNORMAL LOW (ref 4.22–5.81)
RDW: 13 % (ref 11.5–15.5)
WBC: 7.9 10*3/uL (ref 4.0–10.5)
nRBC: 0 % (ref 0.0–0.2)

## 2019-02-12 LAB — HEMOGLOBIN A1C
Hgb A1c MFr Bld: 5.2 % (ref 4.8–5.6)
Mean Plasma Glucose: 102.54 mg/dL

## 2019-02-12 LAB — BRAIN NATRIURETIC PEPTIDE: B Natriuretic Peptide: 59.6 pg/mL (ref 0.0–100.0)

## 2019-02-12 MED ORDER — ASPIRIN 81 MG PO CHEW
81.0000 mg | CHEWABLE_TABLET | Freq: Every day | ORAL | Status: DC
  Administered 2019-02-14: 81 mg via ORAL
  Filled 2019-02-12 (×2): qty 1

## 2019-02-12 MED ORDER — METHOCARBAMOL 500 MG PO TABS: 500.0000 mg | ORAL_TABLET | Freq: Four times a day (QID) | ORAL | Status: DC | PRN

## 2019-02-12 MED ORDER — ENOXAPARIN SODIUM 30 MG/0.3ML ~~LOC~~ SOLN: 30.0000 mg | SUBCUTANEOUS | Status: DC

## 2019-02-12 MED ORDER — OXYCODONE HCL 5 MG PO TABS
5.0000 mg | ORAL_TABLET | ORAL | Status: DC | PRN
  Administered 2019-02-14: 5 mg via ORAL
  Filled 2019-02-12: qty 1

## 2019-02-12 MED ORDER — ASPIRIN EC 325 MG PO TBEC: 325.0000 mg | DELAYED_RELEASE_TABLET | Freq: Every day | ORAL | Status: DC

## 2019-02-12 MED ORDER — MORPHINE SULFATE (PF) 2 MG/ML IV SOLN
2.0000 mg | INTRAVENOUS | Status: DC | PRN
  Administered 2019-02-12 – 2019-02-14 (×3): 2 mg via INTRAVENOUS
  Filled 2019-02-12 (×3): qty 1

## 2019-02-12 MED ORDER — SODIUM CHLORIDE 0.9 % IV SOLN
INTRAVENOUS | Status: DC
  Administered 2019-02-13: 06:00:00 via INTRAVENOUS

## 2019-02-12 MED ORDER — ASPIRIN 81 MG PO CHEW
324.0000 mg | CHEWABLE_TABLET | Freq: Once | ORAL | Status: AC
  Administered 2019-02-12: 324 mg via ORAL
  Filled 2019-02-12: qty 4

## 2019-02-12 MED ORDER — DOCUSATE SODIUM 100 MG PO CAPS
100.0000 mg | ORAL_CAPSULE | Freq: Two times a day (BID) | ORAL | Status: DC
  Administered 2019-02-12 – 2019-02-14 (×4): 100 mg via ORAL
  Filled 2019-02-12 (×4): qty 1

## 2019-02-12 MED ORDER — LORAZEPAM 0.5 MG PO TABS
0.5000 mg | ORAL_TABLET | Freq: Once | ORAL | Status: AC
  Administered 2019-02-12: 0.5 mg via ORAL
  Filled 2019-02-12: qty 1

## 2019-02-12 MED ORDER — SODIUM CHLORIDE 0.9% FLUSH: 3.0000 mL | INTRAVENOUS | Status: DC | PRN

## 2019-02-12 MED ORDER — ONDANSETRON HCL 4 MG PO TABS: 4.0000 mg | ORAL_TABLET | Freq: Four times a day (QID) | ORAL | Status: DC | PRN

## 2019-02-12 MED ORDER — NITROGLYCERIN IN D5W 200-5 MCG/ML-% IV SOLN
0.0000 ug/min | INTRAVENOUS | Status: DC
  Administered 2019-02-12: 5 ug/min via INTRAVENOUS
  Administered 2019-02-14: 30 ug/min via INTRAVENOUS
  Filled 2019-02-12 (×2): qty 250

## 2019-02-12 MED ORDER — IOHEXOL 350 MG/ML SOLN
100.0000 mL | Freq: Once | INTRAVENOUS | Status: AC | PRN
  Administered 2019-02-12: 100 mL via INTRAVENOUS

## 2019-02-12 MED ORDER — SODIUM CHLORIDE 0.9% FLUSH
3.0000 mL | Freq: Two times a day (BID) | INTRAVENOUS | Status: DC
  Administered 2019-02-13 – 2019-02-19 (×4): 3 mL via INTRAVENOUS

## 2019-02-12 MED ORDER — SODIUM CHLORIDE 0.9 % IV SOLN: 250.0000 mL | INTRAVENOUS | Status: DC | PRN

## 2019-02-12 MED ORDER — ONDANSETRON HCL 4 MG/2ML IJ SOLN: 4.0000 mg | Freq: Four times a day (QID) | INTRAMUSCULAR | Status: DC | PRN

## 2019-02-12 MED ORDER — ROSUVASTATIN CALCIUM 20 MG PO TABS
40.0000 mg | ORAL_TABLET | Freq: Every day | ORAL | Status: DC
  Administered 2019-02-12 – 2019-02-18 (×6): 40 mg via ORAL
  Filled 2019-02-12 (×6): qty 2

## 2019-02-12 MED ORDER — FUROSEMIDE 10 MG/ML IJ SOLN
40.0000 mg | Freq: Once | INTRAMUSCULAR | Status: AC
  Administered 2019-02-12: 40 mg via INTRAVENOUS
  Filled 2019-02-12: qty 4

## 2019-02-12 MED ORDER — ALUM & MAG HYDROXIDE-SIMETH 200-200-20 MG/5ML PO SUSP
30.0000 mL | Freq: Once | ORAL | Status: AC
  Administered 2019-02-13: 30 mL via ORAL
  Filled 2019-02-12: qty 30

## 2019-02-12 MED ORDER — SODIUM CHLORIDE 0.9 % IV BOLUS: 250.0000 mL | Freq: Once | INTRAVENOUS | Status: DC

## 2019-02-12 MED ORDER — LIDOCAINE VISCOUS HCL 2 % MT SOLN
15.0000 mL | Freq: Once | OROMUCOSAL | Status: AC
  Administered 2019-02-13: 15 mL via ORAL
  Filled 2019-02-12: qty 15

## 2019-02-12 MED ORDER — ASPIRIN 81 MG PO CHEW
81.0000 mg | CHEWABLE_TABLET | ORAL | Status: AC
  Administered 2019-02-13: 81 mg via ORAL

## 2019-02-12 MED ORDER — ENOXAPARIN SODIUM 40 MG/0.4ML ~~LOC~~ SOLN: 40.0000 mg | SUBCUTANEOUS | Status: DC

## 2019-02-12 MED ORDER — SODIUM CHLORIDE 0.9% FLUSH
3.0000 mL | Freq: Two times a day (BID) | INTRAVENOUS | Status: DC
  Administered 2019-02-12 – 2019-02-19 (×4): 3 mL via INTRAVENOUS

## 2019-02-12 MED ORDER — HEPARIN BOLUS VIA INFUSION
4000.0000 [IU] | Freq: Once | INTRAVENOUS | Status: AC
  Administered 2019-02-12: 4000 [IU] via INTRAVENOUS
  Filled 2019-02-12: qty 4000

## 2019-02-12 MED ORDER — NITROGLYCERIN 0.4 MG SL SUBL
SUBLINGUAL_TABLET | SUBLINGUAL | Status: AC
  Administered 2019-02-12: 0.4 mg
  Filled 2019-02-12: qty 1

## 2019-02-12 MED ORDER — HEPARIN (PORCINE) 25000 UT/250ML-% IV SOLN
900.0000 [IU]/h | INTRAVENOUS | Status: DC
  Administered 2019-02-12: 900 [IU]/h via INTRAVENOUS
  Filled 2019-02-12: qty 250

## 2019-02-12 MED ORDER — SODIUM CHLORIDE 0.9% FLUSH: 3.0000 mL | Freq: Once | INTRAVENOUS | Status: DC

## 2019-02-12 MED ORDER — METOPROLOL TARTRATE 12.5 MG HALF TABLET
12.5000 mg | ORAL_TABLET | Freq: Two times a day (BID) | ORAL | Status: DC
  Administered 2019-02-12 – 2019-02-13 (×3): 12.5 mg via ORAL
  Filled 2019-02-12 (×3): qty 1

## 2019-02-12 NOTE — ED Notes (Signed)
Patient transported to CT 

## 2019-02-12 NOTE — ED Provider Notes (Signed)
[facility] EMERGENCY DEPARTMENT Provider Note   CSN: [id number] Arrival date & time: 02/12/19  1231     History   Chief Complaint Chief Complaint  Patient presents with   Weakness   Chest Pain    HPI Roger [name] is a 73 y.o. male.     HPI  73 year old male presents today complaining of chest pain and dyspnea for 2 weeks.  He states it was intermittent for about 2 weeks.  He describes the substernal chest pain as like heartburn.  He had associated shortness of breath with this.  Approximately 2 days ago, he noted that it became constant.  During that time he is unable to tell me thing that has made worse or made it is off.  He has not taken any medications or done any interventions.  He has had no similar symptoms in the past.  Upon review of symptoms he endorses that he feels like he has had some swelling in his right calf for the past several weeks.  He has no known history of DVT or PE.  He denies any cough, fever, nasal congestion, or exposure to COVID patients.  He notes some orthopnea.  No past medical history on file.  There are no active problems to display for this patient.   Past Surgical History:  Procedure Laterality Date   BACK SURGERY          Home Medications    Prior to Admission medications   Medication Sig Start Date End Date Taking? Authorizing Provider  aspirin EC 325 MG tablet Take 1 tablet (325 mg total) by mouth daily. 04/19/12   Wylene Simmer, MD  docusate sodium (COLACE) 100 MG capsule Take 1 capsule (100 mg total) by mouth 2 (two) times daily. While taking narcotic pain medicine. 04/19/12   Wylene Simmer, MD  methocarbamol (ROBAXIN) 500 MG tablet Take 1 tablet (500 mg total) by mouth every 6 (six) hours as needed (muscle spasm). 04/19/12   Wylene Simmer, MD  oxyCODONE (OXY IR/ROXICODONE) 5 MG immediate release tablet Take 1-2 tablets (5-10 mg total) by mouth every 4 (four) hours as needed for pain (moderate to severe pain).  04/19/12   Wylene Simmer, MD  senna (SENOKOT) 8.6 MG tablet Take 2 tablets (17.2 mg total) by mouth daily. While taking narcotic pain medicine. 04/19/12   Wylene Simmer, MD    Family History No family history on file.  Social History Social History   Tobacco Use   Smoking status: Never Smoker  Substance Use Topics   Alcohol use: No   Drug use: Not on file     Allergies   Patient has no known allergies.   Review of Systems Review of Systems  All other systems reviewed and are negative.    Physical Exam Updated Vital Signs BP (!) 152/89 (BP Location: Right Arm)    Pulse 91    Temp 97.9 F (36.6 C) (Oral)    Resp 20    SpO2 100%   Physical Exam Vitals signs reviewed.  Constitutional:      General: He is not in acute distress.    Appearance: He is well-developed and normal weight. He is not ill-appearing.     Comments: Tachypneic  HENT:     Head: Normocephalic.  Eyes:     Pupils: Pupils are equal, round, and reactive to light.  Neck:     Musculoskeletal: Normal range of motion.  Cardiovascular:     Rate and Rhythm: Normal  rate and regular rhythm.     Heart sounds: Normal heart sounds.  Pulmonary:     Effort: Pulmonary effort is normal.     Breath sounds: Normal breath sounds.  Abdominal:     General: Bowel sounds are normal.     Palpations: Abdomen is soft.  Musculoskeletal: Normal range of motion.     Right lower leg: He exhibits tenderness. No edema.     Left lower leg: He exhibits no tenderness. No edema.     Comments: Mild tenderness to palpation of calf on right lower extremity  Skin:    General: Skin is warm and dry.     Capillary Refill: Capillary refill takes less than 2 seconds.  Neurological:     General: No focal deficit present.     Mental Status: He is alert.  Psychiatric:        Mood and Affect: Mood normal.      ED Treatments / Results  Labs (all labs ordered are listed, but only abnormal results are displayed) Labs Reviewed  CBC -  Abnormal; Notable for the following components:      Result Value   RBC 3.91 (*)    MCV 102.6 (*)    MCH 36.3 (*)    All other components within normal limits  POCT I-STAT EG7 - Abnormal; Notable for the following components:   pH, Ven 7.511 (*)    pCO2, Ven 27.9 (*)    pO2, Ven 63.0 (*)    Calcium, Ion 1.09 (*)    All other components within normal limits  SARS CORONAVIRUS 2 (TAT 6-24 HRS)  BASIC METABOLIC PANEL  BLOOD GAS, VENOUS  TROPONIN I (HIGH SENSITIVITY)    EKG EKG Interpretation  Date/Time:  Monday February 12 2019 12:38:14 EDT Ventricular Rate:  95 PR Interval:  134 QRS Duration: 76 QT Interval:  382 QTC Calculation: 480 R Axis:   27 Text Interpretation:  Normal sinus rhythm Nonspecific ST abnormality Prolonged QT Abnormal ECG Confirmed by Pattricia Boss [phone]) on 02/12/2019 1:04:53 PM Also confirmed by Pattricia Boss [phone]), editor Hattie Perch ([id number])  on 02/12/2019 1:39:17 PM   Radiology Ct Angio Chest Pe W And/or Wo Contrast  Result Date: 02/12/2019 CLINICAL DATA:  73 year old male with 2 weeks of intermittent substernal chest pain and subjective fever. EXAM: CT ANGIOGRAPHY CHEST WITH CONTRAST TECHNIQUE: Multidetector CT imaging of the chest was performed using the standard protocol during bolus administration of intravenous contrast. Multiplanar CT image reconstructions and MIPs were obtained to evaluate the vascular anatomy. CONTRAST:  141mL OMNIPAQUE IOHEXOL 350 MG/ML SOLN COMPARISON:  None. FINDINGS: Cardiovascular: Adequate opacification of the pulmonary arteries to the segmental level. No evidence of central filling defect to suggest pulmonary embolism. The heart is normal in size. No pericardial effusion. Calcifications visualized along the coronary arteries. Tortuous thoracic aorta with trace atherosclerotic calcifications. No evidence of aneurysm. Mediastinum/Nodes: Unremarkable CT appearance of the thyroid gland. No suspicious mediastinal or hilar  adenopathy. No soft tissue mediastinal mass. Moderately large hiatal hernia. Lungs/Pleura: Minimal dependent atelectasis. No suspicious pulmonary mass or nodule. No evidence of emphysema or fibrosis. No pleural effusion or pneumothorax. Upper Abdomen: Visualized upper abdominal organs are unremarkable. Musculoskeletal: No acute fracture or aggressive appearing lytic or blastic osseous lesion. Review of the MIP images confirms the above findings. IMPRESSION: 1. Negative for pulmonary embolus, pneumonia or other acute cardiopulmonary process. 2. Moderately large hiatal hernia. This could represent a source for intermittent substernal chest pain. 3. Mild aortic atherosclerotic calcifications. Aortic  Atherosclerosis (ICD10-170.0) 4. Coronary artery calcifications are also present. Electronically Signed   By: Jacqulynn Cadet M.D.   On: 02/12/2019 15:25   Dg Chest Port 1 View  Result Date: 02/12/2019 CLINICAL DATA:  Shortness of breath and chest pain over the last day. EXAM: PORTABLE CHEST 1 VIEW COMPARISON:  None. FINDINGS: Cardiomegaly. Aortic atherosclerosis and tortuosity. Pulmonary venous hypertension, possibly with early interstitial edema. Question small effusion on the left. No acute bone finding. IMPRESSION: Probable acute congestive heart failure with cardiomegaly, pulmonary venous hypertension and early interstitial edema. Electronically Signed   By: Nelson Chimes M.D.   On: 02/12/2019 14:02    Procedures .Critical Care Performed by: Pattricia Boss, MD Authorized by: Pattricia Boss, MD   Critical care provider statement:    Critical care time (minutes):  45   Critical care end time:  02/12/2019 4:00 PM   Critical care was necessary to treat or prevent imminent or life-threatening deterioration of the following conditions:  Cardiac failure and respiratory failure   Critical care was time spent personally by me on the following activities:  Discussions with consultants, evaluation of patient's  response to treatment, examination of patient, ordering and performing treatments and interventions, ordering and review of laboratory studies, ordering and review of radiographic studies, pulse oximetry, re-evaluation of patient's condition, obtaining history from patient or surrogate and review of old charts   (including critical care time)  Medications Ordered in ED Medications  sodium chloride flush (NS) 0.9 % injection 3 mL (has no administration in time range)     Initial Impression / Assessment and Plan / ED Course  I have reviewed the triage vital signs and the nursing notes.  Pertinent labs & imaging results that were available during my care of the patient were reviewed by me and considered in my medical decision making (see chart for details).   73 yo male previously healthy, not on medications presents with one week of intermittent chest pain and dyspnea, now with 2 days of constant sscp burning in nature with associated dyspnea.  Patient with increased doe. Patient has noted some pain and swelling in rle, but denies ho dvt or pe. No infectious sxs and no known covid exposures.  DDX PE Cardiac ischemia chf Infection-cap/covid Cardiomyopathy/myocarditis/pericardial effusion  EKG with diffuse nsst, initial troponin 35 Some interstitial edema on plain cxr with cardiomegaly but no definitive edema, or effusion NO PE  Patient remains tachypneic with rr 25, pH 7.511, pco2 down to 27 consistent with respiratory alkalosis without definitive diagnosis  Dr Laren Everts consulted for admission  Cardiology consulted and seeing patient  He remains hemodynamically stable.  Care discussed with patient, wife, and daughter.   Final Clinical Impressions(s) / ED Diagnoses   Final diagnoses:  None    ED Discharge Orders    None       Pattricia Boss, MD 02/12/19 [phone]

## 2019-02-12 NOTE — H&P (Signed)
[facility]                                                                                    Patient Demographics  Roger [name], is a 73 y.o. male  CSN: [id number]  MRN: [id number]  DOB - [birth year]  Admit Date - 02/12/2019  Outpatient Primary MD for the patient is Rosine Door, MD   With History of -  No past medical history on file.    Past Surgical History:  Procedure Laterality Date  . BACK SURGERY      in for   Chief Complaint  Patient presents with  . Weakness  . Chest Pain     HPI  Roger [name]  is a 73 y.o. male, with no significant past medical history, presenting today with chest pain of one-week duration worsening in the last 2 days, associated shortness of breath, nausea but no vomiting and cough.  Patient denies any diarrhea, reports feverishness. No history of being exposed to patients with COVID-19. Work-up in the emergency room showed a creatinine of 1.25 white blood cell count 7.9 hemoglobin 14.2 . CT of the chest was negative for any abnormality .      Review of Systems    In addition to the HPI above,  No Fever-chills, No Headache, No changes with Vision or hearing, No problems swallowing food or Liquids, No Abdominal pain, No Vommitting, Bowel movements are regular, No Blood in stool or Urine, No dysuria, No new skin rashes or bruises, No new joints pains-aches,  No new weakness, tingling, numbness in any extremity, No recent weight gain or loss, No polyuria, polydypsia or polyphagia, No significant Mental Stressors.  A full 10 point Review of Systems was done, except as stated above, all other Review of Systems were negative.   Social History Social History   Tobacco Use  . Smoking status: Never Smoker  Substance Use Topics  . Alcohol use: No    Family History No family history on file.   Prior to Admission medications   Medication Sig Start Date End Date Taking? Authorizing Provider  aspirin  EC 325 MG tablet Take 1 tablet (325 mg total) by mouth daily. 04/19/12   Wylene Simmer, MD  docusate sodium (COLACE) 100 MG capsule Take 1 capsule (100 mg total) by mouth 2 (two) times daily. While taking narcotic pain medicine. 04/19/12   Wylene Simmer, MD  methocarbamol (ROBAXIN) 500 MG tablet Take 1 tablet (500 mg total) by mouth every 6 (six) hours as needed (muscle spasm). 04/19/12   Wylene Simmer, MD  oxyCODONE (OXY IR/ROXICODONE) 5 MG immediate release tablet Take 1-2 tablets (5-10 mg total) by mouth every 4 (four) hours as needed for pain (moderate to severe pain). 04/19/12   Wylene Simmer, MD  senna (SENOKOT) 8.6 MG tablet Take 2 tablets (17.2 mg total) by mouth daily. While taking narcotic pain medicine. 04/19/12   Wylene Simmer, MD    No Known Allergies  Physical Exam  Vitals  Blood pressure (!) 148/87, pulse 80, temperature 97.9 F (36.6 C), temperature source Oral, resp. rate (!) 22, SpO2 95 %.  GA : Well developed in mod SOB .  HEENT no jaundice or pallor, no facial deviation oral thrush Neck supple, no neck vein distention Chest clear and resonant Heart normal S1-S2, no murmurs gallops or rubs Abdomen soft, nontender, bowel sounds present Extremities no clubbing cyanosis or edema Skin no rashes or ulcers Neuro grossly nonfocal   Data Review  CBC Recent Labs  Lab 02/12/19 1259 02/12/19 1328  WBC 7.9  --   HGB 14.2 13.9  HCT 40.1 41.0  PLT 258  --   MCV 102.6*  --   MCH 36.3*  --   MCHC 35.4  --   RDW 13.0  --    ------------------------------------------------------------------------------------------------------------------  Chemistries  Recent Labs  Lab 02/12/19 1259 02/12/19 1328  NA 142 142  K 4.1 4.1  CL 108  --   CO2 21*  --   GLUCOSE 104*  --   BUN 14  --   CREATININE 1.25*  --   CALCIUM 9.4  --    ------------------------------------------------------------------------------------------------------------------ CrCl cannot be calculated  (Unknown ideal weight.). ------------------------------------------------------------------------------------------------------------------ No results for input(s): TSH, T4TOTAL, T3FREE, THYROIDAB in the last 72 hours.  Invalid input(s): FREET3   Coagulation profile No results for input(s): INR, PROTIME in the last 168 hours. ------------------------------------------------------------------------------------------------------------------- No results for input(s): DDIMER in the last 72 hours. -------------------------------------------------------------------------------------------------------------------  Cardiac Enzymes No results for input(s): CKMB, TROPONINI, MYOGLOBIN in the last 168 hours.  Invalid input(s): CK ------------------------------------------------------------------------------------------------------------------ Invalid input(s): POCBNP   ---------------------------------------------------------------------------------------------------------------  Urinalysis No results found for: COLORURINE, APPEARANCEUR, LABSPEC, [place], GLUCOSEU, HGBUR, BILIRUBINUR, KETONESUR, PROTEINUR, UROBILINOGEN, NITRITE, LEUKOCYTESUR  ----------------------------------------------------------------------------------------------------------------     Imaging results:   Ct Angio Chest Pe W And/or Wo Contrast  Result Date: 02/12/2019 CLINICAL DATA:  73 year old male with 2 weeks of intermittent substernal chest pain and subjective fever. EXAM: CT ANGIOGRAPHY CHEST WITH CONTRAST TECHNIQUE: Multidetector CT imaging of the chest was performed using the standard protocol during bolus administration of intravenous contrast. Multiplanar CT image reconstructions and MIPs were obtained to evaluate the vascular anatomy. CONTRAST:  169mL OMNIPAQUE IOHEXOL 350 MG/ML SOLN COMPARISON:  None. FINDINGS: Cardiovascular: Adequate opacification of the pulmonary arteries to the segmental level. No evidence  of central filling defect to suggest pulmonary embolism. The heart is normal in size. No pericardial effusion. Calcifications visualized along the coronary arteries. Tortuous thoracic aorta with trace atherosclerotic calcifications. No evidence of aneurysm. Mediastinum/Nodes: Unremarkable CT appearance of the thyroid gland. No suspicious mediastinal or hilar adenopathy. No soft tissue mediastinal mass. Moderately large hiatal hernia. Lungs/Pleura: Minimal dependent atelectasis. No suspicious pulmonary mass or nodule. No evidence of emphysema or fibrosis. No pleural effusion or pneumothorax. Upper Abdomen: Visualized upper abdominal organs are unremarkable. Musculoskeletal: No acute fracture or aggressive appearing lytic or blastic osseous lesion. Review of the MIP images confirms the above findings. IMPRESSION: 1. Negative for pulmonary embolus, pneumonia or other acute cardiopulmonary process. 2. Moderately large hiatal hernia. This could represent a source for intermittent substernal chest pain. 3. Mild aortic atherosclerotic calcifications. Aortic Atherosclerosis (ICD10-170.0) 4. Coronary artery calcifications are also present. Electronically Signed   By: Jacqulynn Cadet M.D.   On: 02/12/2019 15:25   Dg Chest Port 1 View  Result Date: 02/12/2019 CLINICAL DATA:  Shortness of breath and chest pain over the last day. EXAM: PORTABLE CHEST 1 VIEW COMPARISON:  None. FINDINGS: Cardiomegaly. Aortic atherosclerosis and tortuosity. Pulmonary venous hypertension, possibly with early interstitial edema. Question small effusion on the left. No acute bone finding. IMPRESSION: Probable acute congestive heart failure with cardiomegaly, pulmonary venous hypertension and early interstitial edema.  Electronically Signed   By: Nelson Chimes M.D.   On: 02/12/2019 14:02    My personal review of EKG: Normal sinus rhythm at 95 bpm with early repolarization and nonspecific ST changes  Assessment & Plan  Chest pain ,  Nonspecific EKG changes Serial troponins  Cardiomegaly by chest x-ray Check echocardiogram  Hypoxemia with tachypnea Follow-up with COVID-19 test Check echocardiogram for cardiomegaly Cardiology consult  DVT Prophylaxis Lovenox  AM Labs Ordered, also please review Full Orders  Family Communication: Discussed with family at bedside.  Code Status full  Disposition Plan: Home  Time spent in minutes : 42 minutes  Condition GUARDED   @SIGNATURE @

## 2019-02-12 NOTE — Progress Notes (Signed)
ANTICOAGULATION CONSULT NOTE - Initial Consult  Pharmacy Consult for Heparin Indication: chest pain/ACS  Allergies  Allergen Reactions  . Hydrocodone-Acetaminophen Itching  . Temazepam Itching  . Trazodone And Nefazodone Itching    Patient Measurements:   Heparin Dosing Weight: TBW  Vital Signs: Temp: 97.9 F (36.6 C) (10/12 1242) Temp Source: Oral (10/12 1242) BP: 140/98 (10/12 1630) Pulse Rate: 83 (10/12 1645)  Labs: Recent Labs    02/12/19 1259 02/12/19 1328 02/12/19 1522  HGB 14.2 13.9  --   HCT 40.1 41.0  --   PLT 258  --   --   CREATININE 1.25*  --   --   TROPONINIHS 35*  --  75*    CrCl cannot be calculated (Unknown ideal weight.).   Medical History: Past Medical History:  Diagnosis Date  . Chronic back pain   . HLD (hyperlipidemia)   . HTN (hypertension)     Medications:  Scheduled:  . [START ON 02/13/2019] aspirin  81 mg Oral Daily  . docusate sodium  100 mg Oral BID  . [START ON 02/13/2019] enoxaparin (LOVENOX) injection  40 mg Subcutaneous Q24H  . metoprolol tartrate  12.5 mg Oral BID  . rosuvastatin  40 mg Oral q1800  . sodium chloride flush  3 mL Intravenous Once  . sodium chloride flush  3 mL Intravenous Q12H  . sodium chloride flush  3 mL Intravenous Q12H   Infusions:  . sodium chloride    . heparin 900 Units/hr (02/12/19 1826)  . nitroGLYCERIN 10 mcg/min (02/12/19 1915)    Assessment: Patient is a 73 y/o male with PMH of HTN, HLD, and chronic back pain. He presents with chest pain that is one-week in duration (worsening the last 2 days), and associated SOB and nausea. Pt's CT chest was negative but troponins are elevated 35>75. EKG shows non-specific changes. Pharmacy has been consulted to dose heparin with plan for [facility] tomorrow.  Goal of Therapy:  Heparin level 0.3-0.7 units/ml Monitor platelets by anticoagulation protocol: Yes   Plan:  Give 4000 units bolus x 1 Start heparin infusion at 900 units/hr Check anti-Xa level in 8  hours and daily while on heparin Continue to monitor H&H and platelets  Sherren Kerns, PharmD PGY1 Acute Care Pharmacy Resident 02/12/2019,7:34 PM

## 2019-02-12 NOTE — ED Triage Notes (Signed)
Pt reports 2 weeks of off and on substernal CP. Reports subjective fever. No sick contacts. No hx of CP, stents. VSS.

## 2019-02-12 NOTE — Progress Notes (Signed)
Spoke with Baltazar Najjar from [facility] after getting an EKG, titrating NTG drip up and placing O2 on patient for chest pain rated 7/10, will continue to monitor. Patient came up to unit from ED with NTG drip at 25 mcgs and now at 40-50 mcgs to relieve chest pain, will continue to monitor.

## 2019-02-12 NOTE — Consult Note (Addendum)
Cardiology Consultation:   Patient ID: Roger [name]; [id number]; Jun 16, 1945   Admit date: 02/12/2019 Date of Consult: 02/12/2019  Primary Care Provider: Rosine Door, MD Primary Cardiologist: New to [place]; Dr. Claiborne Billings Primary Electrophysiologist:  None   Patient Profile:   Roger [name] is a 73 y.o. male with a PMH of borderline HTN, HLD, and chronic back pain who is being seen today for the evaluation of chest pain and SOB at the request of Dr. Jeanell Sparrow.  History of Present Illness:   Mr. Spomer was in his usual state of health until a couple weeks ago when he began experiencing intermittent chest pain with associated SOB. He reported sharp, burning chest pain occurring with exertion with associated SOB. Pain generally lasts for 10 minutes before resolving spontaneously with rest. This morning he had an episode of chest pain while out to breakfast, again with associated SOB but also diaphoresis, lightheadedness, and nausea. This episode persisted for >1 hour prompting him to present to the ED for further evaluation.  He denies prior cardiac history. He does not follow with a cardiologist. He has never had an ischemic evaluation in the past. He has had borderline HTN but is not on any medications. He reports family history of CAD in his father and brother both with MI's in their late 40s . Risk factors for CAD include HTN, HLD, and family history.  At the time of this evaluation he reports active burning chest pain. He was given SL nitro x1 dose with improvement in symptoms. He also has had some orthopnea and PND over the past couple days. He denies significant weight gain or LE edema. He denies dizziness or palpitations. No recent fevers, cough, abdominal pain, or urinary complaints.   ED course: Hypertensive, intermittently tachypneic, otherwise VSS. Labs notable for electrolytes wnl, Cr 1.25, CBC wnl, BNP 59.6, HsTroponin 35>75. EKG with sinus rhythm with non-specific  ST-T wave abnormalities (no comparison). CXR with probable acute CHF with cardiomegaly, pulmonary venous HTN, and early interstitial edema. CTA Chest without PE, but +hiatal hernia, and mild aortic atherosclerosis and coronary artery calcifications. He was given IV lasix 40mg  x1 in the ED. Cardiology asked to evaluate for chest pain and possible new onset CHF.    Past Medical History:  Diagnosis Date   Chronic back pain    HLD (hyperlipidemia)    HTN (hypertension)     Past Surgical History:  Procedure Laterality Date   BACK SURGERY       Home Medications:  Prior to Admission medications   Medication Sig Start Date End Date Taking? Authorizing Provider  aspirin EC 325 MG tablet Take 1 tablet (325 mg total) by mouth daily. 04/19/12   Wylene Simmer, MD  docusate sodium (COLACE) 100 MG capsule Take 1 capsule (100 mg total) by mouth 2 (two) times daily. While taking narcotic pain medicine. 04/19/12   Wylene Simmer, MD  methocarbamol (ROBAXIN) 500 MG tablet Take 1 tablet (500 mg total) by mouth every 6 (six) hours as needed (muscle spasm). 04/19/12   Wylene Simmer, MD  oxyCODONE (OXY IR/ROXICODONE) 5 MG immediate release tablet Take 1-2 tablets (5-10 mg total) by mouth every 4 (four) hours as needed for pain (moderate to severe pain). 04/19/12   Wylene Simmer, MD  senna (SENOKOT) 8.6 MG tablet Take 2 tablets (17.2 mg total) by mouth daily. While taking narcotic pain medicine. 04/19/12   Wylene Simmer, MD    Inpatient Medications: Scheduled Meds:  aspirin EC  325 mg  Oral Daily   docusate sodium  100 mg Oral BID   [START ON 02/13/2019] enoxaparin (LOVENOX) injection  40 mg Subcutaneous Q24H   sodium chloride flush  3 mL Intravenous Once   sodium chloride flush  3 mL Intravenous Q12H   Continuous Infusions:  sodium chloride     PRN Meds:   Allergies:   No Known Allergies  Social History:   Social History   Socioeconomic History   Marital status: Married    Spouse name: Not  on file   Number of children: Not on file   Years of education: Not on file   Highest education level: Not on file  Occupational History   Not on file  Social Needs   Financial resource strain: Not on file   Food insecurity    Worry: Not on file    Inability: Not on file   Transportation needs    Medical: Not on file    Non-medical: Not on file  Tobacco Use   Smoking status: Never Smoker  Substance and Sexual Activity   Alcohol use: No   Drug use: Not on file   Sexual activity: Not on file  Lifestyle   Physical activity    Days per week: Not on file    Minutes per session: Not on file   Stress: Not on file  Relationships   Social connections    Talks on phone: Not on file    Gets together: Not on file    Attends religious service: Not on file    Active member of club or organization: Not on file    Attends meetings of clubs or organizations: Not on file    Relationship status: Not on file   Intimate partner violence    Fear of current or ex partner: Not on file    Emotionally abused: Not on file    Physically abused: Not on file    Forced sexual activity: Not on file  Other Topics Concern   Not on file  Social History Narrative   Not on file    Family History:    Family History  Problem Relation Age of Onset   Heart disease Father    Heart disease Brother      ROS:  Please see the history of present illness.   All other ROS reviewed and negative.     Physical Exam/Data:   Vitals:   02/12/19 1315 02/12/19 1330 02/12/19 1400 02/12/19 1430  BP: (!) 144/91 140/84 (!) 152/83 (!) 148/87  Pulse: 87 87 80 80  Resp: (!) 27 (!) 25 (!) 21 (!) 22  Temp:      TempSrc:      SpO2: 99% 95% 95% 95%    Intake/Output Summary (Last 24 hours) at 02/12/2019 1633 Last data filed at 02/12/2019 1554 Gross per 24 hour  Intake --  Output 500 ml  Net -500 ml   There were no vitals filed for this visit. There is no height or weight on file to  calculate BMI.  General:  Well nourished, well developed, in no acute distress HEENT: sclera anicteric  Neck: no JVD Vascular: No carotid bruits; distal pulses 2+ bilaterally Cardiac:  normal S1, S2; RRR; no murmurs, rubs, or gallops Lungs:  clear to auscultation bilaterally, no wheezing, rhonchi or rales  Abd: NABS, soft, nontender, no hepatomegaly Ext: no edema Musculoskeletal:  No deformities, BUE and BLE strength normal and equal Skin: warm and dry  Neuro:  CNs 2-12 intact,  no focal abnormalities noted Psych:  Normal affect   EKG:  The EKG was personally reviewed and demonstrates:  Sinus rhythm with rate 95, QTc 480, non-specific ST-T wave abnormalities, no significant STE/D; repeat EKG at the time of my exam with continue ST-T wave abnormalities with submm STE V1-2, isolated TWI III, submm STD in II, V5-6. Telemetry:  Telemetry was personally reviewed and demonstrates:  NSR  Relevant CV Studies: None  Laboratory Data:  Chemistry Recent Labs  Lab 02/12/19 1259 02/12/19 1328  NA 142 142  K 4.1 4.1  CL 108  --   CO2 21*  --   GLUCOSE 104*  --   BUN 14  --   CREATININE 1.25*  --   CALCIUM 9.4  --   GFRNONAA 58*  --   GFRAA >60  --   ANIONGAP 13  --     No results for input(s): PROT, ALBUMIN, AST, ALT, ALKPHOS, BILITOT in the last 168 hours. Hematology Recent Labs  Lab 02/12/19 1259 02/12/19 1328  WBC 7.9  --   RBC 3.91*  --   HGB 14.2 13.9  HCT 40.1 41.0  MCV 102.6*  --   MCH 36.3*  --   MCHC 35.4  --   RDW 13.0  --   PLT 258  --    Cardiac EnzymesNo results for input(s): TROPONINI in the last 168 hours. No results for input(s): TROPIPOC in the last 168 hours.  BNP Recent Labs  Lab 02/12/19 1259  BNP 59.6    DDimer No results for input(s): DDIMER in the last 168 hours.  Radiology/Studies:  Ct Angio Chest Pe W And/or Wo Contrast  Result Date: 02/12/2019 CLINICAL DATA:  73 year old male with 2 weeks of intermittent substernal chest pain and  subjective fever. EXAM: CT ANGIOGRAPHY CHEST WITH CONTRAST TECHNIQUE: Multidetector CT imaging of the chest was performed using the standard protocol during bolus administration of intravenous contrast. Multiplanar CT image reconstructions and MIPs were obtained to evaluate the vascular anatomy. CONTRAST:  172mL OMNIPAQUE IOHEXOL 350 MG/ML SOLN COMPARISON:  None. FINDINGS: Cardiovascular: Adequate opacification of the pulmonary arteries to the segmental level. No evidence of central filling defect to suggest pulmonary embolism. The heart is normal in size. No pericardial effusion. Calcifications visualized along the coronary arteries. Tortuous thoracic aorta with trace atherosclerotic calcifications. No evidence of aneurysm. Mediastinum/Nodes: Unremarkable CT appearance of the thyroid gland. No suspicious mediastinal or hilar adenopathy. No soft tissue mediastinal mass. Moderately large hiatal hernia. Lungs/Pleura: Minimal dependent atelectasis. No suspicious pulmonary mass or nodule. No evidence of emphysema or fibrosis. No pleural effusion or pneumothorax. Upper Abdomen: Visualized upper abdominal organs are unremarkable. Musculoskeletal: No acute fracture or aggressive appearing lytic or blastic osseous lesion. Review of the MIP images confirms the above findings. IMPRESSION: 1. Negative for pulmonary embolus, pneumonia or other acute cardiopulmonary process. 2. Moderately large hiatal hernia. This could represent a source for intermittent substernal chest pain. 3. Mild aortic atherosclerotic calcifications. Aortic Atherosclerosis (ICD10-170.0) 4. Coronary artery calcifications are also present. Electronically Signed   By: Jacqulynn Cadet M.D.   On: 02/12/2019 15:25   Dg Chest Port 1 View  Result Date: 02/12/2019 CLINICAL DATA:  Shortness of breath and chest pain over the last day. EXAM: PORTABLE CHEST 1 VIEW COMPARISON:  None. FINDINGS: Cardiomegaly. Aortic atherosclerosis and tortuosity. Pulmonary venous  hypertension, possibly with early interstitial edema. Question small effusion on the left. No acute bone finding. IMPRESSION: Probable acute congestive heart failure with cardiomegaly, pulmonary venous hypertension  and early interstitial edema. Electronically Signed   By: Nelson Chimes M.D.   On: 02/12/2019 14:02    Assessment and Plan:   1. Chest pain: Patient presented with intermittent exertional chest pain with associated SOB x2 weeks. Responsive to SL nitro. BNP 59.6. Troponin 35. EKG with sinus rhythm with non-specific ST-T wave abnormalities (no comparison). CXR with probable acute CHF with cardiomegaly, pulmonary venous HTN, and early interstitial edema. CTA Chest without PE, but +hiatal hernia, and mild aortic atherosclerosis and coronary artery calcifications. Risk factors for CAD include HTN, HLD, and family history of CAD. Symptoms concerning for unstable angina. - Will start heparin gtt - Will get an echocardiogram to further evaluate LV function and wall motion - Continue to trend trop to peak - Will plan for definitive evaluation with LHC tomorrow - NPO after MN - Will check a Hgb A1C for risk stratification  2. Acute CHF: patient with evidence of cardiomegaly and early interstitial edema on CXR. BNP 59. He was given IV lasix 40mg  in the ED. Overall he appears euvolemic on exam - Will hold additional lasix at this time - Will get an echocardiogram to further evaluate LV function  3. HTN: BP elevated. Patient reported to have "borderline" HTN at PCP visit. Not on medications.  - Anticipate management in the context of #1 and 2 - further recommendations pending work-up.  4. HLD: LDL 115 04/2018 on crestor 10mg  daily at home. - Will increase crestor to 40mg  daily   For questions or updates, please contact [facility] Please consult [email or web address] for contact info under Cardiology/STEMI.   Signed, Abigail Butts, PA-C  02/12/2019 4:33 PM [phone]  Patient seen and  examined. Agree with assessment and plan.  Mr. Devone is a 73 year old gentleman who has a history of mild hypertension, hyperlipidemia, who has developed exertionally precipitated chest discomfort associated with shortness of breath.  It seems to have become progressive.  He has a family history for any artery disease both in his brother and father.  Today he experienced an episode of burning chest tightness which persisted.  Ultimately upon arrival to the emergency room his chest pain was definitely nitrate responsive.  He was hypertensive.  Presently, his blood pressure is improved but still elevated at 150/90.  HEENT is unremarkable.  He did not have any carotid bruits.  Lungs were clear.  There was no definite chest wall tenderness to palpation.  Rhythm was regular with a faint 1/6 systolic murmur.  Abdomen was soft nontender.  Pulses were adequate.  There was no edema.  Initial ECG revealed normal sinus rhythm at 95 bpm with nonspecific T wave abnormalities.  He also had mild prolongation of his QTc interval.  A chest x-ray raised the possibility of cardiomegaly with early interstitial edema.  A CTA of his chest did not reveal any PE.  He was noted to have moderate size hiatal hernia as well as aortic atherosclerosis and coronary artery calcifications.  Presently he continues to have some mild residual chest discomfort.  I will initiate IV nitroglycerin.  Plan to initiate heparin.  Troponins will be cycled.  With his exertional precipitation associated with shortness of breath, and significant improvement following nitroglycerin administration, symptoms are worrisome for ischemia.  It is my recommendation that he undergo definitive cardiac catheterization with possible intervention if necessary.I have reviewed the risks, indications, and alternatives to cardiac catheterization, possible angioplasty, and stenting with the patient. Risks include but are not limited to bleeding, infection, vascular injury,  stroke, myocardial infection, arrhythmia, kidney injury, radiation-related injury in the case of prolonged fluoroscopy use, emergency cardiac surgery, and death. The patient understands the risks of serious complication is 1-2 in [id number] with diagnostic cardiac cath and 1-2% or less with angioplasty/stenting.  He agrees to pursue the definitive evaluation and will plan for catheterization tomorrow.   Troy Sine, MD, [facility] 02/12/2019 6:09 PM

## 2019-02-13 ENCOUNTER — Other Ambulatory Visit: Payer: Self-pay

## 2019-02-13 ENCOUNTER — Inpatient Hospital Stay (HOSPITAL_COMMUNITY): Payer: Medicare Other

## 2019-02-13 ENCOUNTER — Encounter (HOSPITAL_COMMUNITY)
Admission: EM | Disposition: A | Discharge status: Home Health | Payer: Self-pay | Source: Home / Self Care | Attending: Cardiothoracic Surgery

## 2019-02-13 ENCOUNTER — Encounter (HOSPITAL_COMMUNITY): Payer: Self-pay

## 2019-02-13 DIAGNOSIS — R0602 Shortness of breath: Secondary | ICD-10-CM

## 2019-02-13 DIAGNOSIS — R079 Chest pain, unspecified: Secondary | ICD-10-CM

## 2019-02-13 DIAGNOSIS — I200 Unstable angina: Secondary | ICD-10-CM

## 2019-02-13 HISTORY — PX: LEFT HEART CATH AND CORONARY ANGIOGRAPHY: CATH118249

## 2019-02-13 LAB — ECHOCARDIOGRAM COMPLETE
Height: 70 in
Weight: 2651.2 oz

## 2019-02-13 LAB — HEPARIN LEVEL (UNFRACTIONATED)
Heparin Unfractionated: 0.35 IU/mL (ref 0.30–0.70)
Heparin Unfractionated: 0.35 IU/mL (ref 0.30–0.70)
Heparin Unfractionated: 0.34 IU/mL (ref 0.30–0.70)

## 2019-02-13 LAB — BASIC METABOLIC PANEL
Anion gap: 12 (ref 5–15)
BUN: 18 mg/dL (ref 8–23)
CO2: 25 mmol/L (ref 22–32)
Calcium: 9.3 mg/dL (ref 8.9–10.3)
Chloride: 100 mmol/L (ref 98–111)
Creatinine, Ser: 1.29 mg/dL — ABNORMAL HIGH (ref 0.61–1.24)
GFR calc Af Amer: >60 mL/min (ref >60)
GFR calc non Af Amer: 56 mL/min — ABNORMAL LOW (ref >60)
Glucose, Bld: 132 mg/dL — ABNORMAL HIGH (ref 70–99)
Potassium: 3.4 mmol/L — ABNORMAL LOW (ref 3.5–5.1)
Sodium: 137 mmol/L (ref 135–145)

## 2019-02-13 LAB — TROPONIN I (HIGH SENSITIVITY): Troponin I (High Sensitivity): 97 ng/L — ABNORMAL HIGH (ref <18)

## 2019-02-13 LAB — CBC
HCT: 37.3 % — ABNORMAL LOW (ref 39.0–52.0)
Hemoglobin: 12.6 g/dL — ABNORMAL LOW (ref 13.0–17.0)
MCH: 34.7 pg — ABNORMAL HIGH (ref 26.0–34.0)
MCHC: 33.8 g/dL (ref 30.0–36.0)
MCV: 102.8 fL — ABNORMAL HIGH (ref 80.0–100.0)
Platelets: 249 10*3/uL (ref 150–400)
RBC: 3.63 MIL/uL — ABNORMAL LOW (ref 4.22–5.81)
RDW: 13.2 % (ref 11.5–15.5)
WBC: 8.4 10*3/uL (ref 4.0–10.5)
nRBC: 0 % (ref 0.0–0.2)

## 2019-02-13 LAB — HIV ANTIBODY (ROUTINE TESTING W REFLEX): HIV Screen 4th Generation wRfx: NONREACTIVE

## 2019-02-13 SURGERY — LEFT HEART CATH AND CORONARY ANGIOGRAPHY
Anesthesia: LOCAL

## 2019-02-13 MED ORDER — FENTANYL CITRATE (PF) 100 MCG/2ML IJ SOLN
INTRAMUSCULAR | Status: DC | PRN
  Administered 2019-02-13: 50 ug via INTRAVENOUS

## 2019-02-13 MED ORDER — IOHEXOL 350 MG/ML SOLN
INTRAVENOUS | Status: DC | PRN
  Administered 2019-02-13: 60 mL via INTRA_ARTERIAL

## 2019-02-13 MED ORDER — HEPARIN (PORCINE) IN NACL 1000-0.9 UT/500ML-% IV SOLN
INTRAVENOUS | Status: DC | PRN
  Administered 2019-02-13: 500 mL

## 2019-02-13 MED ORDER — LIDOCAINE HCL (PF) 1 % IJ SOLN
INTRAMUSCULAR | Status: AC
  Filled 2019-02-13: qty 30

## 2019-02-13 MED ORDER — HYDRALAZINE HCL 20 MG/ML IJ SOLN: 10.0000 mg | INTRAMUSCULAR | Status: AC | PRN

## 2019-02-13 MED ORDER — VERAPAMIL HCL 2.5 MG/ML IV SOLN
INTRAVENOUS | Status: DC | PRN
  Administered 2019-02-13: 10 mL via INTRA_ARTERIAL

## 2019-02-13 MED ORDER — POTASSIUM CHLORIDE CRYS ER 20 MEQ PO TBCR
20.0000 meq | EXTENDED_RELEASE_TABLET | Freq: Once | ORAL | Status: AC
  Administered 2019-02-13: 20 meq via ORAL
  Filled 2019-02-13: qty 1

## 2019-02-13 MED ORDER — LABETALOL HCL 5 MG/ML IV SOLN: 10.0000 mg | INTRAVENOUS | Status: AC | PRN

## 2019-02-13 MED ORDER — FENTANYL CITRATE (PF) 100 MCG/2ML IJ SOLN
INTRAMUSCULAR | Status: AC
  Filled 2019-02-13: qty 2

## 2019-02-13 MED ORDER — HEPARIN SODIUM (PORCINE) 1000 UNIT/ML IJ SOLN
INTRAMUSCULAR | Status: AC
  Filled 2019-02-13: qty 1

## 2019-02-13 MED ORDER — VERAPAMIL HCL 2.5 MG/ML IV SOLN
INTRAVENOUS | Status: AC
  Filled 2019-02-13: qty 2

## 2019-02-13 MED ORDER — SODIUM CHLORIDE 0.9 % IV SOLN: 250.0000 mL | INTRAVENOUS | Status: DC | PRN

## 2019-02-13 MED ORDER — MIDAZOLAM HCL 2 MG/2ML IJ SOLN
INTRAMUSCULAR | Status: DC | PRN
  Administered 2019-02-13: 2 mg via INTRAVENOUS

## 2019-02-13 MED ORDER — HEPARIN SODIUM (PORCINE) 1000 UNIT/ML IJ SOLN
INTRAMUSCULAR | Status: DC | PRN
  Administered 2019-02-13: 4000 [IU] via INTRAVENOUS

## 2019-02-13 MED ORDER — POTASSIUM CHLORIDE CRYS ER 20 MEQ PO TBCR
40.0000 meq | EXTENDED_RELEASE_TABLET | Freq: Once | ORAL | Status: AC
  Administered 2019-02-13: 40 meq via ORAL
  Filled 2019-02-13: qty 2

## 2019-02-13 MED ORDER — HEPARIN (PORCINE) 25000 UT/250ML-% IV SOLN
1000.0000 [IU]/h | INTRAVENOUS | Status: DC
  Administered 2019-02-13: 900 [IU]/h via INTRAVENOUS
  Filled 2019-02-13: qty 250

## 2019-02-13 MED ORDER — SODIUM CHLORIDE 0.9% FLUSH: 3.0000 mL | INTRAVENOUS | Status: DC | PRN

## 2019-02-13 MED ORDER — HEPARIN (PORCINE) IN NACL 1000-0.9 UT/500ML-% IV SOLN
INTRAVENOUS | Status: AC
  Filled 2019-02-13: qty 1000

## 2019-02-13 MED ORDER — SODIUM CHLORIDE 0.9% FLUSH
3.0000 mL | Freq: Two times a day (BID) | INTRAVENOUS | Status: DC
  Administered 2019-02-13 – 2019-02-18 (×8): 3 mL via INTRAVENOUS

## 2019-02-13 MED ORDER — LIDOCAINE HCL (PF) 1 % IJ SOLN
INTRAMUSCULAR | Status: DC | PRN
  Administered 2019-02-13: 2 mL

## 2019-02-13 MED ORDER — MIDAZOLAM HCL 2 MG/2ML IJ SOLN
INTRAMUSCULAR | Status: AC
  Filled 2019-02-13: qty 2

## 2019-02-13 MED ORDER — INFLUENZA VAC A&B SA ADJ QUAD 0.5 ML IM PRSY
0.5000 mL | PREFILLED_SYRINGE | INTRAMUSCULAR | Status: DC | PRN
  Filled 2019-02-13: qty 0.5

## 2019-02-13 MED ORDER — SODIUM CHLORIDE 0.9 % IV SOLN: INTRAVENOUS | Status: AC

## 2019-02-13 SURGICAL SUPPLY — 10 items
CATH OPTITORQUE TIG 4.0 5F (CATHETERS) ×2 IMPLANT
DEVICE RAD COMP TR BAND LRG (VASCULAR PRODUCTS) ×2 IMPLANT
GLIDESHEATH SLEND SS 6F .021 (SHEATH) ×2 IMPLANT
GUIDEWIRE INQWIRE 1.5J.035X260 (WIRE) ×1 IMPLANT
INQWIRE 1.5J .035X260CM (WIRE) ×2
KIT HEART LEFT (KITS) ×2 IMPLANT
PACK CARDIAC CATHETERIZATION (CUSTOM PROCEDURE TRAY) ×2 IMPLANT
SHEATH PROBE COVER 6X72 (BAG) ×2 IMPLANT
TRANSDUCER W/STOPCOCK (MISCELLANEOUS) ×2 IMPLANT
TUBING CIL FLEX 10 FLL-RA (TUBING) ×2 IMPLANT

## 2019-02-13 NOTE — Progress Notes (Signed)
  Echocardiogram 2D Echocardiogram has been performed.  Roger [name] 02/13/2019, 2:30 PM

## 2019-02-13 NOTE — Progress Notes (Addendum)
Progress Note  Patient Name: Roger [name] Date of Encounter: 02/13/2019  Primary Cardiologist:  Shelva Majestic, MD  Subjective   Had chest pain last pm, prolonged, eventually got relief. SOB is better.  Inpatient Medications    Scheduled Meds: . aspirin  81 mg Oral Daily  . docusate sodium  100 mg Oral BID  . metoprolol tartrate  12.5 mg Oral BID  . rosuvastatin  40 mg Oral q1800  . sodium chloride flush  3 mL Intravenous Once  . sodium chloride flush  3 mL Intravenous Q12H  . sodium chloride flush  3 mL Intravenous Q12H   Continuous Infusions: . sodium chloride    . sodium chloride    . sodium chloride 10 mL/hr at 02/13/19 0544  . heparin 900 Units/hr (02/12/19 1829)  . nitroGLYCERIN 15 mcg/min (02/13/19 0655)  . sodium chloride     PRN Meds: sodium chloride, sodium chloride, influenza vaccine adjuvanted, methocarbamol, morphine injection, ondansetron **OR** ondansetron (ZOFRAN) IV, oxyCODONE, sodium chloride flush, sodium chloride flush   Vital Signs    Vitals:   02/13/19 0620 02/13/19 0635 02/13/19 0736 02/13/19 0746  BP: 119/85 111/82 105/73   Pulse: 84 81 79 86  Resp: 19 15 16 13   Temp:    97.7 F (36.5 C)  TempSrc:   Oral Oral  SpO2: 97% 94% 93% 95%  Weight:      Height:        Intake/Output Summary (Last 24 hours) at 02/13/2019 0820 Last data filed at 02/13/2019 0600 Gross per 24 hour  Intake 347.26 ml  Output 1580 ml  Net -1232.74 ml   Filed Weights   02/12/19 2100 02/13/19 0535  Weight: 75.5 kg 75.2 kg   Last Weight  Most recent update: 02/13/2019  5:48 AM   Weight  75.2 kg (165 lb 11.2 oz)           Weight change:    Telemetry    SR, ST - Personally Reviewed  ECG    None today - Personally Reviewed  Physical Exam   General: Well developed, well nourished, male appearing in no acute distress. Head: Normocephalic, atraumatic.  Neck: Supple without bruits, JVD not elevated. Lungs:  Resp regular and unlabored, CTA. Heart:  RRR, S1, S2, no S3, S4, or murmur; no rub. Abdomen: Soft, non-tender, non-distended with normoactive bowel sounds. No hepatomegaly. No rebound/guarding. No obvious abdominal masses. Extremities: No clubbing, cyanosis, no edema. Distal pedal pulses are 2+ bilaterally. Neuro: Alert and oriented X 3. Moves all extremities spontaneously. Psych: Normal affect.  Labs    Hematology Recent Labs  Lab 02/12/19 1259 02/12/19 1328 02/13/19 0233  WBC 7.9  --  8.4  RBC 3.91*  --  3.63*  HGB 14.2 13.9 12.6*  HCT 40.1 41.0 37.3*  MCV 102.6*  --  102.8*  MCH 36.3*  --  34.7*  MCHC 35.4  --  33.8  RDW 13.0  --  13.2  PLT 258  --  249    Chemistry Recent Labs  Lab 02/12/19 1259 02/12/19 1328 02/13/19 0233  NA 142 142 137  K 4.1 4.1 3.4*  CL 108  --  100  CO2 21*  --  25  GLUCOSE 104*  --  132*  BUN 14  --  18  CREATININE 1.25*  --  1.29*  CALCIUM 9.4  --  9.3  GFRNONAA 58*  --  56*  GFRAA >60  --  >60  ANIONGAP 13  --  12  High Sensitivity Troponin:   Recent Labs  Lab 02/12/19 1259 02/12/19 1522 02/12/19 2116 02/12/19 2317  TROPONINIHS 35* 75* 103* 97*     BNP Recent Labs  Lab 02/12/19 1259  BNP 59.6    No results found for: CHOL, HDL, LDLCALC, LDLDIRECT, TRIG, CHOLHDL   Radiology    Ct Angio Chest Pe W And/or Wo Contrast  Result Date: 02/12/2019 CLINICAL DATA:  73 year old male with 2 weeks of intermittent substernal chest pain and subjective fever. EXAM: CT ANGIOGRAPHY CHEST WITH CONTRAST TECHNIQUE: Multidetector CT imaging of the chest was performed using the standard protocol during bolus administration of intravenous contrast. Multiplanar CT image reconstructions and MIPs were obtained to evaluate the vascular anatomy. CONTRAST:  149mL OMNIPAQUE IOHEXOL 350 MG/ML SOLN COMPARISON:  None. FINDINGS: Cardiovascular: Adequate opacification of the pulmonary arteries to the segmental level. No evidence of central filling defect to suggest pulmonary embolism. The heart  is normal in size. No pericardial effusion. Calcifications visualized along the coronary arteries. Tortuous thoracic aorta with trace atherosclerotic calcifications. No evidence of aneurysm. Mediastinum/Nodes: Unremarkable CT appearance of the thyroid gland. No suspicious mediastinal or hilar adenopathy. No soft tissue mediastinal mass. Moderately large hiatal hernia. Lungs/Pleura: Minimal dependent atelectasis. No suspicious pulmonary mass or nodule. No evidence of emphysema or fibrosis. No pleural effusion or pneumothorax. Upper Abdomen: Visualized upper abdominal organs are unremarkable. Musculoskeletal: No acute fracture or aggressive appearing lytic or blastic osseous lesion. Review of the MIP images confirms the above findings. IMPRESSION: 1. Negative for pulmonary embolus, pneumonia or other acute cardiopulmonary process. 2. Moderately large hiatal hernia. This could represent a source for intermittent substernal chest pain. 3. Mild aortic atherosclerotic calcifications. Aortic Atherosclerosis (ICD10-170.0) 4. Coronary artery calcifications are also present. Electronically Signed   By: Jacqulynn Cadet M.D.   On: 02/12/2019 15:25   Dg Chest Port 1 View  Result Date: 02/12/2019 CLINICAL DATA:  Shortness of breath and chest pain over the last day. EXAM: PORTABLE CHEST 1 VIEW COMPARISON:  None. FINDINGS: Cardiomegaly. Aortic atherosclerosis and tortuosity. Pulmonary venous hypertension, possibly with early interstitial edema. Question small effusion on the left. No acute bone finding. IMPRESSION: Probable acute congestive heart failure with cardiomegaly, pulmonary venous hypertension and early interstitial edema. Electronically Signed   By: Nelson Chimes M.D.   On: 02/12/2019 14:02     Cardiac Studies   ECHO and CARDIAC CATH:  ORDERED  Patient Profile     73 y.o. male w/ hx borderline HTN, HLD, chronic back pain was admitted 10/12 w/ chest pain & SOB>>cards asked to see.  Assessment & Plan     1. CP, DOE:  - mild trop elevation - CXR concerning for CHF>>treated, fluid not seen on CT - no PE, but large HH seen as well as coronary calcifications - for cath and echo today - no volume overload on exam - continue ASA, high-dose statin, BB, heparin, IV NTG - no questions about cath - will start gentle hydration  2. Early CHF on CXR - s/p Lasix 40 mg IV x 1 - follow volume, no S&S overload now  3. HTN:  - BP well controlled on current rx - continue nitrates, BB  4. HLD:  - Crestor increased to 40 mg qd  5. Hypokalemia - after IV Lasix - supplemented  Active Problems:   Chest pain   SOB (shortness of breath) on exertion   Signed, Rosaria Ferries , PA-C 8:20 AM 02/13/2019 Pager: [phone]   Patient seen and examined. Agree with assessment  and plan. Chest pain improved Hs Trop 35 > 103 > 97. Had some recurrent chest pain last night. On iv NTG.  Will check ECG this am. K 3.2; replete. Macrocytic indices check B12/folate. For cath today. As discussed yesterday, pt aware of risks/benefits.    Troy Sine, MD, [facility] 02/13/2019 10:22 AM

## 2019-02-13 NOTE — Progress Notes (Signed)
PROGRESS NOTE    Roger [name]  [id number] DOB: 30-Oct-1945 DOA: 02/12/2019 PCP: Rosine Door, MD     Brief Narrative:  Roger [name] is a 73 year old male with no significant past medical history who presents with chest pain going on for 1 week.  It has been associated with shortness of breath, nausea without vomiting.  Work-up in the emergency department revealed elevated troponin.  Patient was started on IV nitro, IV heparin, cardiology consulted.  New events last 24 hours / Subjective: Patient states that his chest pain on admission was 10 out of 10, improved to 7 out of 10 this morning.  Shortness of breath has improved today  Assessment & Plan:   Active Problems:   Chest pain   SOB (shortness of breath) on exertion   Chest pain -Troponin peaked at 103 and trended downward -Cardiology consulted -Heart cath planned for 10/13 -Continue aspirin, IV heparin, IV nitro  Cardiomegaly -BNP 59.6 -Echocardiogram pending -Patient received one-time dose IV Lasix -Does not appear to be fluid overloaded on examination  Hypokalemia -Replace, trend  CKD stage III -Baseline creatinine 1.26 in December 2019, per care everywhere -Stable   DVT prophylaxis: IV heparin Code Status: Full code Family Communication: None Disposition Plan: Heart cath planned for today   Consultants:   Cardiology  Procedures:   Heart cath 10/13  Antimicrobials:  Anti-infectives (From admission, onward)   None        Objective: Vitals:   02/13/19 0736 02/13/19 0746 02/13/19 0755 02/13/19 1107  BP: 105/73   116/72  Pulse: 79 86  72  Resp: 16 13  16   Temp:  97.7 F (36.5 C)  97.8 F (36.6 C)  TempSrc: Oral Oral  Oral  SpO2: 93% 95% 95% 94%  Weight:      Height:        Intake/Output Summary (Last 24 hours) at 02/13/2019 1122 Last data filed at 02/13/2019 0600 Gross per 24 hour  Intake 347.26 ml  Output 1580 ml  Net -1232.74 ml   Filed Weights   02/12/19 2100  02/13/19 0535  Weight: 75.5 kg 75.2 kg    Examination:  General exam: Appears calm and comfortable  Respiratory system: Clear to auscultation. Respiratory effort normal. No respiratory distress. No conversational dyspnea.  Cardiovascular system: S1 & S2 heard, RRR. No murmurs. No pedal edema. Gastrointestinal system: Abdomen is nondistended, soft and nontender. Normal bowel sounds heard. Central nervous system: Alert and oriented. No focal neurological deficits. Speech clear.  Extremities: Symmetric in appearance  Skin: No rashes, lesions or ulcers on exposed skin  Psychiatry: Judgement and insight appear normal. Mood & affect appropriate.   Data Reviewed: I have personally reviewed following labs and imaging studies  CBC: Recent Labs  Lab 02/12/19 1259 02/12/19 1328 02/13/19 0233  WBC 7.9  --  8.4  HGB 14.2 13.9 12.6*  HCT 40.1 41.0 37.3*  MCV 102.6*  --  102.8*  PLT 258  --  0000000   Basic Metabolic Panel: Recent Labs  Lab 02/12/19 1259 02/12/19 1328 02/13/19 0233  NA 142 142 137  K 4.1 4.1 3.4*  CL 108  --  100  CO2 21*  --  25  GLUCOSE 104*  --  132*  BUN 14  --  18  CREATININE 1.25*  --  1.29*  CALCIUM 9.4  --  9.3   GFR: Estimated Creatinine Clearance: 55 mL/min (A) (by C-G formula based on SCr of 1.29 mg/dL (H)). Liver Function Tests: No  results for input(s): AST, ALT, ALKPHOS, BILITOT, PROT, ALBUMIN in the last 168 hours. No results for input(s): LIPASE, AMYLASE in the last 168 hours. No results for input(s): AMMONIA in the last 168 hours. Coagulation Profile: No results for input(s): INR, PROTIME in the last 168 hours. Cardiac Enzymes: No results for input(s): CKTOTAL, CKMB, CKMBINDEX, TROPONINI in the last 168 hours. BNP (last 3 results) No results for input(s): PROBNP in the last 8760 hours. HbA1C: Recent Labs    02/12/19 2116  HGBA1C 5.2   CBG: No results for input(s): GLUCAP in the last 168 hours. Lipid Profile: No results for input(s): CHOL,  HDL, LDLCALC, TRIG, CHOLHDL, LDLDIRECT in the last 72 hours. Thyroid Function Tests: No results for input(s): TSH, T4TOTAL, FREET4, T3FREE, THYROIDAB in the last 72 hours. Anemia Panel: No results for input(s): VITAMINB12, FOLATE, FERRITIN, TIBC, IRON, RETICCTPCT in the last 72 hours. Sepsis Labs: No results for input(s): PROCALCITON, LATICACIDVEN in the last 168 hours.  Recent Results (from the past 240 hour(s))  SARS CORONAVIRUS 2 (TAT 6-24 HRS) Nasopharyngeal Nasopharyngeal Swab     Status: None   Collection Time: 02/12/19  1:46 PM   Specimen: Nasopharyngeal Swab  Result Value Ref Range Status   SARS Coronavirus 2 NEGATIVE NEGATIVE Final    Comment: (NOTE) SARS-CoV-2 target nucleic acids are NOT DETECTED. The SARS-CoV-2 RNA is generally detectable in upper and lower respiratory specimens during the acute phase of infection. Negative results do not preclude SARS-CoV-2 infection, do not rule out co-infections with other pathogens, and should not be used as the sole basis for treatment or other patient management decisions. Negative results must be combined with clinical observations, patient history, and epidemiological information. The expected result is Negative. Fact Sheet for Patients: SugarRoll.be Fact Sheet for Healthcare Providers: https://www.woods-mathews.com/ This test is not yet approved or cleared by the [place] FDA and  has been authorized for detection and/or diagnosis of SARS-CoV-2 by FDA under an Emergency Use Authorization (EUA). This EUA will remain  in effect (meaning this test can be used) for the duration of the COVID-19 declaration under Section 56 4(b)(1) of the Act, 21 U.S.C. section 360bbb-3(b)(1), unless the authorization is terminated or revoked sooner. Performed at [facility], [place] [address] [place], [place] [address]       Radiology Studies: Ct Angio Chest Pe W And/or Wo Contrast  Result  Date: 02/12/2019 CLINICAL DATA:  73 year old male with 2 weeks of intermittent substernal chest pain and subjective fever. EXAM: CT ANGIOGRAPHY CHEST WITH CONTRAST TECHNIQUE: Multidetector CT imaging of the chest was performed using the standard protocol during bolus administration of intravenous contrast. Multiplanar CT image reconstructions and MIPs were obtained to evaluate the vascular anatomy. CONTRAST:  135mL OMNIPAQUE IOHEXOL 350 MG/ML SOLN COMPARISON:  None. FINDINGS: Cardiovascular: Adequate opacification of the pulmonary arteries to the segmental level. No evidence of central filling defect to suggest pulmonary embolism. The heart is normal in size. No pericardial effusion. Calcifications visualized along the coronary arteries. Tortuous thoracic aorta with trace atherosclerotic calcifications. No evidence of aneurysm. Mediastinum/Nodes: Unremarkable CT appearance of the thyroid gland. No suspicious mediastinal or hilar adenopathy. No soft tissue mediastinal mass. Moderately large hiatal hernia. Lungs/Pleura: Minimal dependent atelectasis. No suspicious pulmonary mass or nodule. No evidence of emphysema or fibrosis. No pleural effusion or pneumothorax. Upper Abdomen: Visualized upper abdominal organs are unremarkable. Musculoskeletal: No acute fracture or aggressive appearing lytic or blastic osseous lesion. Review of the MIP images confirms the above findings. IMPRESSION: 1.  Negative for pulmonary embolus, pneumonia or other acute cardiopulmonary process. 2. Moderately large hiatal hernia. This could represent a source for intermittent substernal chest pain. 3. Mild aortic atherosclerotic calcifications. Aortic Atherosclerosis (ICD10-170.0) 4. Coronary artery calcifications are also present. Electronically Signed   By: Jacqulynn Cadet M.D.   On: 02/12/2019 15:25   Dg Chest Port 1 View  Result Date: 02/12/2019 CLINICAL DATA:  Shortness of breath and chest pain over the last day. EXAM: PORTABLE CHEST  1 VIEW COMPARISON:  None. FINDINGS: Cardiomegaly. Aortic atherosclerosis and tortuosity. Pulmonary venous hypertension, possibly with early interstitial edema. Question small effusion on the left. No acute bone finding. IMPRESSION: Probable acute congestive heart failure with cardiomegaly, pulmonary venous hypertension and early interstitial edema. Electronically Signed   By: Nelson Chimes M.D.   On: 02/12/2019 14:02      Scheduled Meds: . aspirin  81 mg Oral Daily  . docusate sodium  100 mg Oral BID  . metoprolol tartrate  12.5 mg Oral BID  . rosuvastatin  40 mg Oral q1800  . sodium chloride flush  3 mL Intravenous Once  . sodium chloride flush  3 mL Intravenous Q12H  . sodium chloride flush  3 mL Intravenous Q12H   Continuous Infusions: . sodium chloride    . sodium chloride    . sodium chloride 50 mL/hr at 02/13/19 0843  . heparin 900 Units/hr (02/12/19 1829)  . nitroGLYCERIN 15 mcg/min (02/13/19 0655)  . sodium chloride       LOS: 1 day      Time spent: 35 minutes   Dessa Phi, DO [facility] 02/13/2019, 11:22 AM   Available via Epic secure chat 7am-7pm After these hours, please refer to coverage provider listed on [email or web address]

## 2019-02-13 NOTE — Progress Notes (Signed)
Transported patient to cath lab  With monitor AAO X4,  In no discomforts,IV NTG and NS infusing , Heparin IV discontinued.Daughter Charlett Nose  aware of cath time , phone number given to cath lab staff .

## 2019-02-13 NOTE — Plan of Care (Signed)
  Problem: Cardiovascular: Goal: Vascular access site(s) Level 0-1 will be maintained Outcome: Completed/Met

## 2019-02-13 NOTE — Progress Notes (Signed)
[place] for Heparin Indication: chest pain/ACS  Allergies  Allergen Reactions  . Hydrocodone-Acetaminophen Itching  . Temazepam Itching  . Trazodone And Nefazodone Itching    Patient Measurements: Height: 5\' 10"  (177.8 cm) Weight: 166 lb 6.4 oz (75.5 kg) IBW/kg (Calculated) : 73 Heparin Dosing Weight: TBW  Vital Signs: Temp: 97.4 F (36.3 C) (10/12 2320) Temp Source: Oral (10/12 2320) BP: 95/77 (10/13 0155) Pulse Rate: 75 (10/13 0155)  Labs: Recent Labs    02/12/19 1259 02/12/19 1328 02/12/19 1522 02/12/19 2116 02/12/19 2317 02/13/19 0233  HGB 14.2 13.9  --   --   --  12.6*  HCT 40.1 41.0  --   --   --  37.3*  PLT 258  --   --   --   --  249  HEPARINUNFRC  --   --   --   --   --  0.35  CREATININE 1.25*  --   --   --   --  1.29*  TROPONINIHS 35*  --  75* 103* 97*  --     Estimated Creatinine Clearance: 55 mL/min (A) (by C-G formula based on SCr of 1.29 mg/dL (H)).   Medical History: Past Medical History:  Diagnosis Date  . Chronic back pain   . HLD (hyperlipidemia)   . HTN (hypertension)     Medications:  Scheduled:  . aspirin  81 mg Oral Pre-Cath  . aspirin  81 mg Oral Daily  . docusate sodium  100 mg Oral BID  . metoprolol tartrate  12.5 mg Oral BID  . rosuvastatin  40 mg Oral q1800  . sodium chloride flush  3 mL Intravenous Once  . sodium chloride flush  3 mL Intravenous Q12H  . sodium chloride flush  3 mL Intravenous Q12H   Infusions:  . sodium chloride    . sodium chloride    . sodium chloride    . heparin 900 Units/hr (02/12/19 1829)  . nitroGLYCERIN 30 mcg/min (02/13/19 0043)  . sodium chloride      Assessment: Patient is a 73 y/o male with PMH of HTN, HLD, and chronic back pain. He presents with chest pain that is one-week in duration (worsening the last 2 days), and associated SOB and nausea. Pt's CT chest was negative but troponins are elevated 35>75. EKG shows non-specific changes. Pharmacy has  been consulted to dose heparin with plan for [facility] tomorrow.  10/13 AM update:  Initial heparin level is therapeutic   Goal of Therapy:  Heparin level 0.3-0.7 units/ml Monitor platelets by anticoagulation protocol: Yes   Plan:  Cont heparin infusion at 900 units/hr Check anti-Xa level in 8 hours and daily while on heparin Continue to monitor H&H and platelets  Narda Bonds, PharmD, BCPS Clinical Pharmacist Phone: [phone]

## 2019-02-13 NOTE — Interval H&P Note (Signed)
History and Physical Interval Note:  02/13/2019 12:00 PM  Roger [name]  has presented today for surgery, with the diagnosis of unstable angina.  The various methods of treatment have been discussed with the patient and family. After consideration of risks, benefits and other options for treatment, the patient has consented to  Procedure(s): LEFT HEART CATH AND CORONARY ANGIOGRAPHY (N/A)  PERCUTANEOUS CORONARY INTERVENTION  as a surgical intervention.  The patient's history has been reviewed, patient examined, no change in status, stable for surgery.  I have reviewed the patient's chart and labs.  Questions were answered to the patient's satisfaction.     Cath Lab Visit (complete for each Cath Lab visit)  Clinical Evaluation Leading to the Procedure:   ACS: Yes.    Non-ACS:    Anginal Classification: CCS III  Anti-ischemic medical therapy: Minimal Therapy (1 class of medications)  Non-Invasive Test Results: No non-invasive testing performed  Prior CABG: No previous CABG    Glenetta Hew

## 2019-02-13 NOTE — Progress Notes (Signed)
[place] for Heparin Indication: chest pain/ACS  Allergies  Allergen Reactions  . Hydrocodone-Acetaminophen Itching  . Temazepam Itching  . Trazodone And Nefazodone Itching    Patient Measurements: Height: 5\' 10"  (177.8 cm) Weight: 165 lb 11.2 oz (75.2 kg) IBW/kg (Calculated) : 73 Heparin Dosing Weight: TBW  Vital Signs: Temp: 97.5 F (36.4 C) (10/13 1252) Temp Source: Oral (10/13 1252) BP: 164/78 (10/13 1252) Pulse Rate: 81 (10/13 1252)  Labs: Recent Labs    02/12/19 1259 02/12/19 1328 02/12/19 1522 02/12/19 2116 02/12/19 2317 02/13/19 0233 02/13/19 0346 02/13/19 1120  HGB 14.2 13.9  --   --   --  12.6*  --   --   HCT 40.1 41.0  --   --   --  37.3*  --   --   PLT 258  --   --   --   --  249  --   --   HEPARINUNFRC  --   --   --   --   --  0.35 0.35 0.34  CREATININE 1.25*  --   --   --   --  1.29*  --   --   TROPONINIHS 35*  --  75* 103* 97*  --   --   --     Estimated Creatinine Clearance: 55 mL/min (A) (by C-G formula based on SCr of 1.29 mg/dL (H)).   Medical History: Past Medical History:  Diagnosis Date  . Chronic back pain   . HLD (hyperlipidemia)   . HTN (hypertension)     Medications:  Scheduled:  . aspirin  81 mg Oral Daily  . docusate sodium  100 mg Oral BID  . metoprolol tartrate  12.5 mg Oral BID  . rosuvastatin  40 mg Oral q1800  . sodium chloride flush  3 mL Intravenous Once  . sodium chloride flush  3 mL Intravenous Q12H  . sodium chloride flush  3 mL Intravenous Q12H  . sodium chloride flush  3 mL Intravenous Q12H   Infusions:  . sodium chloride    . sodium chloride    . sodium chloride    . nitroGLYCERIN 15 mcg/min (02/13/19 0655)  . sodium chloride      Assessment: Patient is a 73 y/o male with CP s/p cath with LAD disease and plans for CABG vs PCI.  Pharmacy to restart heparin 8 hours post sheath removal  Goal of Therapy:  Heparin level 0.3-0.7 units/ml Monitor platelets by  anticoagulation protocol: Yes   Plan:  -Restart heparin at 900 units/hr at 8:30pm -Daily heparin level and CBC  Hildred Laser, PharmD Clinical Pharmacist **Pharmacist phone directory can now be found on [email or web address] ([email or web address]).  Listed under [place].

## 2019-02-13 NOTE — Progress Notes (Signed)
Spoke with Dr Cyndee Brightly re: chest pain still unresloved with NTG drip at 50 mcgs and his B/P is getting soft, also informed him of troponins trending up, ordered Gi Cocktail since he's C/O burning in his diaphragm area and I can also give another dose of morphine a little early, will give a 250cc bolus of NSS if B/P continues to be soft and will continue to monitor.

## 2019-02-13 NOTE — Procedures (Signed)
Echo attempted. Nurse stated patient being transported to cath lab shortly. Will attempt later.

## 2019-02-13 NOTE — H&P (View-Only) (Signed)
Progress Note  Patient Name: Roger [name] Date of Encounter: 02/13/2019  Primary Cardiologist:  Shelva Majestic, MD  Subjective   Had chest pain last pm, prolonged, eventually got relief. SOB is better.  Inpatient Medications    Scheduled Meds: . aspirin  81 mg Oral Daily  . docusate sodium  100 mg Oral BID  . metoprolol tartrate  12.5 mg Oral BID  . rosuvastatin  40 mg Oral q1800  . sodium chloride flush  3 mL Intravenous Once  . sodium chloride flush  3 mL Intravenous Q12H  . sodium chloride flush  3 mL Intravenous Q12H   Continuous Infusions: . sodium chloride    . sodium chloride    . sodium chloride 10 mL/hr at 02/13/19 0544  . heparin 900 Units/hr (02/12/19 1829)  . nitroGLYCERIN 15 mcg/min (02/13/19 0655)  . sodium chloride     PRN Meds: sodium chloride, sodium chloride, influenza vaccine adjuvanted, methocarbamol, morphine injection, ondansetron **OR** ondansetron (ZOFRAN) IV, oxyCODONE, sodium chloride flush, sodium chloride flush   Vital Signs    Vitals:   02/13/19 0620 02/13/19 0635 02/13/19 0736 02/13/19 0746  BP: 119/85 111/82 105/73   Pulse: 84 81 79 86  Resp: 19 15 16 13   Temp:    97.7 F (36.5 C)  TempSrc:   Oral Oral  SpO2: 97% 94% 93% 95%  Weight:      Height:        Intake/Output Summary (Last 24 hours) at 02/13/2019 0820 Last data filed at 02/13/2019 0600 Gross per 24 hour  Intake 347.26 ml  Output 1580 ml  Net -1232.74 ml   Filed Weights   02/12/19 2100 02/13/19 0535  Weight: 75.5 kg 75.2 kg   Last Weight  Most recent update: 02/13/2019  5:48 AM   Weight  75.2 kg (165 lb 11.2 oz)           Weight change:    Telemetry    SR, ST - Personally Reviewed  ECG    None today - Personally Reviewed  Physical Exam   General: Well developed, well nourished, male appearing in no acute distress. Head: Normocephalic, atraumatic.  Neck: Supple without bruits, JVD not elevated. Lungs:  Resp regular and unlabored, CTA. Heart:  RRR, S1, S2, no S3, S4, or murmur; no rub. Abdomen: Soft, non-tender, non-distended with normoactive bowel sounds. No hepatomegaly. No rebound/guarding. No obvious abdominal masses. Extremities: No clubbing, cyanosis, no edema. Distal pedal pulses are 2+ bilaterally. Neuro: Alert and oriented X 3. Moves all extremities spontaneously. Psych: Normal affect.  Labs    Hematology Recent Labs  Lab 02/12/19 1259 02/12/19 1328 02/13/19 0233  WBC 7.9  --  8.4  RBC 3.91*  --  3.63*  HGB 14.2 13.9 12.6*  HCT 40.1 41.0 37.3*  MCV 102.6*  --  102.8*  MCH 36.3*  --  34.7*  MCHC 35.4  --  33.8  RDW 13.0  --  13.2  PLT 258  --  249    Chemistry Recent Labs  Lab 02/12/19 1259 02/12/19 1328 02/13/19 0233  NA 142 142 137  K 4.1 4.1 3.4*  CL 108  --  100  CO2 21*  --  25  GLUCOSE 104*  --  132*  BUN 14  --  18  CREATININE 1.25*  --  1.29*  CALCIUM 9.4  --  9.3  GFRNONAA 58*  --  56*  GFRAA >60  --  >60  ANIONGAP 13  --  12  High Sensitivity Troponin:   Recent Labs  Lab 02/12/19 1259 02/12/19 1522 02/12/19 2116 02/12/19 2317  TROPONINIHS 35* 75* 103* 97*     BNP Recent Labs  Lab 02/12/19 1259  BNP 59.6    No results found for: CHOL, HDL, LDLCALC, LDLDIRECT, TRIG, CHOLHDL   Radiology    Ct Angio Chest Pe W And/or Wo Contrast  Result Date: 02/12/2019 CLINICAL DATA:  73 year old male with 2 weeks of intermittent substernal chest pain and subjective fever. EXAM: CT ANGIOGRAPHY CHEST WITH CONTRAST TECHNIQUE: Multidetector CT imaging of the chest was performed using the standard protocol during bolus administration of intravenous contrast. Multiplanar CT image reconstructions and MIPs were obtained to evaluate the vascular anatomy. CONTRAST:  178mL OMNIPAQUE IOHEXOL 350 MG/ML SOLN COMPARISON:  None. FINDINGS: Cardiovascular: Adequate opacification of the pulmonary arteries to the segmental level. No evidence of central filling defect to suggest pulmonary embolism. The heart  is normal in size. No pericardial effusion. Calcifications visualized along the coronary arteries. Tortuous thoracic aorta with trace atherosclerotic calcifications. No evidence of aneurysm. Mediastinum/Nodes: Unremarkable CT appearance of the thyroid gland. No suspicious mediastinal or hilar adenopathy. No soft tissue mediastinal mass. Moderately large hiatal hernia. Lungs/Pleura: Minimal dependent atelectasis. No suspicious pulmonary mass or nodule. No evidence of emphysema or fibrosis. No pleural effusion or pneumothorax. Upper Abdomen: Visualized upper abdominal organs are unremarkable. Musculoskeletal: No acute fracture or aggressive appearing lytic or blastic osseous lesion. Review of the MIP images confirms the above findings. IMPRESSION: 1. Negative for pulmonary embolus, pneumonia or other acute cardiopulmonary process. 2. Moderately large hiatal hernia. This could represent a source for intermittent substernal chest pain. 3. Mild aortic atherosclerotic calcifications. Aortic Atherosclerosis (ICD10-170.0) 4. Coronary artery calcifications are also present. Electronically Signed   By: Jacqulynn Cadet M.D.   On: 02/12/2019 15:25   Dg Chest Port 1 View  Result Date: 02/12/2019 CLINICAL DATA:  Shortness of breath and chest pain over the last day. EXAM: PORTABLE CHEST 1 VIEW COMPARISON:  None. FINDINGS: Cardiomegaly. Aortic atherosclerosis and tortuosity. Pulmonary venous hypertension, possibly with early interstitial edema. Question small effusion on the left. No acute bone finding. IMPRESSION: Probable acute congestive heart failure with cardiomegaly, pulmonary venous hypertension and early interstitial edema. Electronically Signed   By: Nelson Chimes M.D.   On: 02/12/2019 14:02     Cardiac Studies   ECHO and CARDIAC CATH:  ORDERED  Patient Profile     73 y.o. male w/ hx borderline HTN, HLD, chronic back pain was admitted 10/12 w/ chest pain & SOB>>cards asked to see.  Assessment & Plan     1. CP, DOE:  - mild trop elevation - CXR concerning for CHF>>treated, fluid not seen on CT - no PE, but large HH seen as well as coronary calcifications - for cath and echo today - no volume overload on exam - continue ASA, high-dose statin, BB, heparin, IV NTG - no questions about cath - will start gentle hydration  2. Early CHF on CXR - s/p Lasix 40 mg IV x 1 - follow volume, no S&S overload now  3. HTN:  - BP well controlled on current rx - continue nitrates, BB  4. HLD:  - Crestor increased to 40 mg qd  5. Hypokalemia - after IV Lasix - supplemented  Active Problems:   Chest pain   SOB (shortness of breath) on exertion   Signed, Rosaria Ferries , PA-C 8:20 AM 02/13/2019 Pager: [phone]   Patient seen and examined. Agree with assessment  and plan. Chest pain improved Hs Trop 35 > 103 > 97. Had some recurrent chest pain last night. On iv NTG.  Will check ECG this am. K 3.2; replete. Macrocytic indices check B12/folate. For cath today. As discussed yesterday, pt aware of risks/benefits.    Troy Sine, MD, [facility] 02/13/2019 10:22 AM

## 2019-02-14 ENCOUNTER — Inpatient Hospital Stay (HOSPITAL_COMMUNITY): Payer: Medicare Other | Admitting: Certified Registered"

## 2019-02-14 ENCOUNTER — Inpatient Hospital Stay (HOSPITAL_COMMUNITY): Payer: Medicare Other

## 2019-02-14 ENCOUNTER — Encounter (HOSPITAL_COMMUNITY): Payer: Self-pay | Admitting: *Deleted

## 2019-02-14 ENCOUNTER — Encounter (HOSPITAL_COMMUNITY)
Admission: EM | Disposition: A | Discharge status: Home Health | Payer: Self-pay | Source: Home / Self Care | Attending: Cardiothoracic Surgery

## 2019-02-14 DIAGNOSIS — I200 Unstable angina: Secondary | ICD-10-CM

## 2019-02-14 DIAGNOSIS — I25110 Atherosclerotic heart disease of native coronary artery with unstable angina pectoris: Secondary | ICD-10-CM

## 2019-02-14 DIAGNOSIS — Z951 Presence of aortocoronary bypass graft: Secondary | ICD-10-CM

## 2019-02-14 DIAGNOSIS — Z01810 Encounter for preprocedural cardiovascular examination: Secondary | ICD-10-CM

## 2019-02-14 DIAGNOSIS — I2510 Atherosclerotic heart disease of native coronary artery without angina pectoris: Secondary | ICD-10-CM

## 2019-02-14 DIAGNOSIS — D7589 Other specified diseases of blood and blood-forming organs: Secondary | ICD-10-CM

## 2019-02-14 HISTORY — PX: CORONARY ARTERY BYPASS GRAFT: SHX141

## 2019-02-14 HISTORY — PX: TEE WITHOUT CARDIOVERSION: SHX5443

## 2019-02-14 LAB — POCT I-STAT, CHEM 8
BUN: 13 mg/dL (ref 8–23)
BUN: 12 mg/dL (ref 8–23)
BUN: 11 mg/dL (ref 8–23)
BUN: 12 mg/dL (ref 8–23)
BUN: 13 mg/dL (ref 8–23)
Calcium, Ion: 1.18 mmol/L (ref 1.15–1.40)
Calcium, Ion: 1.23 mmol/L (ref 1.15–1.40)
Calcium, Ion: 1.03 mmol/L — ABNORMAL LOW (ref 1.15–1.40)
Calcium, Ion: 1.12 mmol/L — ABNORMAL LOW (ref 1.15–1.40)
Calcium, Ion: 1.11 mmol/L — ABNORMAL LOW (ref 1.15–1.40)
Chloride: 100 mmol/L (ref 98–111)
Chloride: 101 mmol/L (ref 98–111)
Chloride: 96 mmol/L — ABNORMAL LOW (ref 98–111)
Chloride: 97 mmol/L — ABNORMAL LOW (ref 98–111)
Chloride: 98 mmol/L (ref 98–111)
Creatinine, Ser: 1 mg/dL (ref 0.61–1.24)
Creatinine, Ser: 1 mg/dL (ref 0.61–1.24)
Creatinine, Ser: 0.8 mg/dL (ref 0.61–1.24)
Creatinine, Ser: 1 mg/dL (ref 0.61–1.24)
Creatinine, Ser: 1.1 mg/dL (ref 0.61–1.24)
Glucose, Bld: 108 mg/dL — ABNORMAL HIGH (ref 70–99)
Glucose, Bld: 112 mg/dL — ABNORMAL HIGH (ref 70–99)
Glucose, Bld: 103 mg/dL — ABNORMAL HIGH (ref 70–99)
Glucose, Bld: 126 mg/dL — ABNORMAL HIGH (ref 70–99)
Glucose, Bld: 123 mg/dL — ABNORMAL HIGH (ref 70–99)
HCT: 35 % — ABNORMAL LOW (ref 39.0–52.0)
HCT: 29 % — ABNORMAL LOW (ref 39.0–52.0)
HCT: 26 % — ABNORMAL LOW (ref 39.0–52.0)
HCT: 26 % — ABNORMAL LOW (ref 39.0–52.0)
HCT: 26 % — ABNORMAL LOW (ref 39.0–52.0)
Hemoglobin: 11.9 g/dL — ABNORMAL LOW (ref 13.0–17.0)
Hemoglobin: 9.9 g/dL — ABNORMAL LOW (ref 13.0–17.0)
Hemoglobin: 8.8 g/dL — ABNORMAL LOW (ref 13.0–17.0)
Hemoglobin: 8.8 g/dL — ABNORMAL LOW (ref 13.0–17.0)
Hemoglobin: 8.8 g/dL — ABNORMAL LOW (ref 13.0–17.0)
Potassium: 4.5 mmol/L (ref 3.5–5.1)
Potassium: 4.1 mmol/L (ref 3.5–5.1)
Potassium: 4.5 mmol/L (ref 3.5–5.1)
Potassium: 5.2 mmol/L — ABNORMAL HIGH (ref 3.5–5.1)
Potassium: 4.9 mmol/L (ref 3.5–5.1)
Sodium: 134 mmol/L — ABNORMAL LOW (ref 135–145)
Sodium: 135 mmol/L (ref 135–145)
Sodium: 133 mmol/L — ABNORMAL LOW (ref 135–145)
Sodium: 136 mmol/L (ref 135–145)
Sodium: 134 mmol/L — ABNORMAL LOW (ref 135–145)
TCO2: 28 mmol/L (ref 22–32)
TCO2: 25 mmol/L (ref 22–32)
TCO2: 26 mmol/L (ref 22–32)
TCO2: 26 mmol/L (ref 22–32)
TCO2: 25 mmol/L (ref 22–32)

## 2019-02-14 LAB — TYPE AND SCREEN
ABO/RH(D): O POS
Antibody Screen: NEGATIVE

## 2019-02-14 LAB — POCT I-STAT 7, (LYTES, BLD GAS, ICA,H+H)
Acid-Base Excess: 1 mmol/L (ref 0.0–2.0)
Acid-base deficit: 1 mmol/L (ref 0.0–2.0)
Acid-base deficit: 2 mmol/L (ref 0.0–2.0)
Bicarbonate: 26 mmol/L (ref 20.0–28.0)
Bicarbonate: 24.8 mmol/L (ref 20.0–28.0)
Bicarbonate: 22.6 mmol/L (ref 20.0–28.0)
Bicarbonate: 23.9 mmol/L (ref 20.0–28.0)
Calcium, Ion: 1.03 mmol/L — ABNORMAL LOW (ref 1.15–1.40)
Calcium, Ion: 1.11 mmol/L — ABNORMAL LOW (ref 1.15–1.40)
Calcium, Ion: 1.16 mmol/L (ref 1.15–1.40)
Calcium, Ion: 1.19 mmol/L (ref 1.15–1.40)
HCT: 27 % — ABNORMAL LOW (ref 39.0–52.0)
HCT: 30 % — ABNORMAL LOW (ref 39.0–52.0)
HCT: 30 % — ABNORMAL LOW (ref 39.0–52.0)
HCT: 31 % — ABNORMAL LOW (ref 39.0–52.0)
Hemoglobin: 9.2 g/dL — ABNORMAL LOW (ref 13.0–17.0)
Hemoglobin: 10.2 g/dL — ABNORMAL LOW (ref 13.0–17.0)
Hemoglobin: 10.2 g/dL — ABNORMAL LOW (ref 13.0–17.0)
Hemoglobin: 10.5 g/dL — ABNORMAL LOW (ref 13.0–17.0)
O2 Saturation: 100 %
O2 Saturation: 100 %
O2 Saturation: 99 %
O2 Saturation: 98 %
Patient temperature: 36.4
Patient temperature: 37.3
Potassium: 4.4 mmol/L (ref 3.5–5.1)
Potassium: 5 mmol/L (ref 3.5–5.1)
Potassium: 4.2 mmol/L (ref 3.5–5.1)
Potassium: 4.1 mmol/L (ref 3.5–5.1)
Sodium: 136 mmol/L (ref 135–145)
Sodium: 134 mmol/L — ABNORMAL LOW (ref 135–145)
Sodium: 135 mmol/L (ref 135–145)
Sodium: 134 mmol/L — ABNORMAL LOW (ref 135–145)
TCO2: 27 mmol/L (ref 22–32)
TCO2: 26 mmol/L (ref 22–32)
TCO2: 24 mmol/L (ref 22–32)
TCO2: 25 mmol/L (ref 22–32)
pCO2 arterial: 45.5 mmHg (ref 32.0–48.0)
pCO2 arterial: 34.2 mmHg (ref 32.0–48.0)
pCO2 arterial: 33.5 mmHg (ref 32.0–48.0)
pCO2 arterial: 43.9 mmHg (ref 32.0–48.0)
pH, Arterial: 7.366 (ref 7.350–7.450)
pH, Arterial: 7.469 — ABNORMAL HIGH (ref 7.350–7.450)
pH, Arterial: 7.435 (ref 7.350–7.450)
pH, Arterial: 7.345 — ABNORMAL LOW (ref 7.350–7.450)
pO2, Arterial: 374 mmHg — ABNORMAL HIGH (ref 83.0–108.0)
pO2, Arterial: 242 mmHg — ABNORMAL HIGH (ref 83.0–108.0)
pO2, Arterial: 145 mmHg — ABNORMAL HIGH (ref 83.0–108.0)
pO2, Arterial: 109 mmHg — ABNORMAL HIGH (ref 83.0–108.0)

## 2019-02-14 LAB — CBC
HCT: 36.8 % — ABNORMAL LOW (ref 39.0–52.0)
HCT: 29.5 % — ABNORMAL LOW (ref 39.0–52.0)
Hemoglobin: 12.6 g/dL — ABNORMAL LOW (ref 13.0–17.0)
Hemoglobin: 10.3 g/dL — ABNORMAL LOW (ref 13.0–17.0)
MCH: 35.3 pg — ABNORMAL HIGH (ref 26.0–34.0)
MCH: 35.6 pg — ABNORMAL HIGH (ref 26.0–34.0)
MCHC: 34.2 g/dL (ref 30.0–36.0)
MCHC: 34.9 g/dL (ref 30.0–36.0)
MCV: 103.1 fL — ABNORMAL HIGH (ref 80.0–100.0)
MCV: 102.1 fL — ABNORMAL HIGH (ref 80.0–100.0)
Platelets: 211 10*3/uL (ref 150–400)
Platelets: 131 10*3/uL — ABNORMAL LOW (ref 150–400)
RBC: 3.57 MIL/uL — ABNORMAL LOW (ref 4.22–5.81)
RBC: 2.89 MIL/uL — ABNORMAL LOW (ref 4.22–5.81)
RDW: 12.9 % (ref 11.5–15.5)
RDW: 12.5 % (ref 11.5–15.5)
WBC: 9.1 10*3/uL (ref 4.0–10.5)
WBC: 12.2 10*3/uL — ABNORMAL HIGH (ref 4.0–10.5)
nRBC: 0 % (ref 0.0–0.2)
nRBC: 0 % (ref 0.0–0.2)

## 2019-02-14 LAB — ABO/RH: ABO/RH(D): O POS

## 2019-02-14 LAB — PROTIME-INR
INR: 1.3 — ABNORMAL HIGH (ref 0.8–1.2)
Prothrombin Time: 15.9 seconds — ABNORMAL HIGH (ref 11.4–15.2)

## 2019-02-14 LAB — BASIC METABOLIC PANEL
Anion gap: 8 (ref 5–15)
BUN: 14 mg/dL (ref 8–23)
CO2: 25 mmol/L (ref 22–32)
Calcium: 9.3 mg/dL (ref 8.9–10.3)
Chloride: 105 mmol/L (ref 98–111)
Creatinine, Ser: 1.15 mg/dL (ref 0.61–1.24)
GFR calc Af Amer: >60 mL/min (ref >60)
GFR calc non Af Amer: >60 mL/min (ref >60)
Glucose, Bld: 116 mg/dL — ABNORMAL HIGH (ref 70–99)
Potassium: 4.6 mmol/L (ref 3.5–5.1)
Sodium: 138 mmol/L (ref 135–145)

## 2019-02-14 LAB — GLUCOSE, CAPILLARY
Glucose-Capillary: 101 mg/dL — ABNORMAL HIGH (ref 70–99)
Glucose-Capillary: 138 mg/dL — ABNORMAL HIGH (ref 70–99)
Glucose-Capillary: 124 mg/dL — ABNORMAL HIGH (ref 70–99)
Glucose-Capillary: 127 mg/dL — ABNORMAL HIGH (ref 70–99)
Glucose-Capillary: 116 mg/dL — ABNORMAL HIGH (ref 70–99)

## 2019-02-14 LAB — HEPARIN LEVEL (UNFRACTIONATED)
Heparin Unfractionated: 0.14 IU/mL — ABNORMAL LOW (ref 0.30–0.70)
Heparin Unfractionated: 0.14 IU/mL — ABNORMAL LOW (ref 0.30–0.70)

## 2019-02-14 LAB — HEMOGLOBIN AND HEMATOCRIT, BLOOD
HCT: 24.5 % — ABNORMAL LOW (ref 39.0–52.0)
Hemoglobin: 8.6 g/dL — ABNORMAL LOW (ref 13.0–17.0)

## 2019-02-14 LAB — APTT: aPTT: 56 seconds — ABNORMAL HIGH (ref 24–36)

## 2019-02-14 LAB — SURGICAL PCR SCREEN
MRSA, PCR: NEGATIVE
Staphylococcus aureus: NEGATIVE

## 2019-02-14 LAB — ECHO INTRAOPERATIVE TEE
Height: 70 in
Weight: 2702.4 oz

## 2019-02-14 LAB — PLATELET COUNT: Platelets: 150 10*3/uL (ref 150–400)

## 2019-02-14 LAB — TROPONIN I (HIGH SENSITIVITY): Troponin I (High Sensitivity): 18 ng/L — ABNORMAL HIGH (ref <18)

## 2019-02-14 SURGERY — CORONARY ARTERY BYPASS GRAFTING (CABG)
Anesthesia: General | Site: Chest

## 2019-02-14 MED ORDER — ACETAMINOPHEN 650 MG RE SUPP
650.0000 mg | Freq: Once | RECTAL | Status: AC
  Administered 2019-02-14: 650 mg via RECTAL

## 2019-02-14 MED ORDER — DEXMEDETOMIDINE HCL IN NACL 400 MCG/100ML IV SOLN: 0.0000 ug/kg/h | INTRAVENOUS | Status: DC

## 2019-02-14 MED ORDER — VANCOMYCIN HCL 1000 MG IV SOLR
INTRAVENOUS | Status: DC | PRN
  Administered 2019-02-14: 3 g

## 2019-02-14 MED ORDER — LACTATED RINGERS IV SOLN
INTRAVENOUS | Status: DC
  Administered 2019-02-14: 18:00:00 via INTRAVENOUS

## 2019-02-14 MED ORDER — POTASSIUM CHLORIDE 10 MEQ/50ML IV SOLN: 10.0000 meq | INTRAVENOUS | Status: AC

## 2019-02-14 MED ORDER — MAGNESIUM SULFATE 50 % IJ SOLN
40.0000 meq | INTRAMUSCULAR | Status: DC
  Filled 2019-02-14: qty 9.85

## 2019-02-14 MED ORDER — SODIUM CHLORIDE 0.9 % IV SOLN
750.0000 mg | INTRAVENOUS | Status: AC
  Administered 2019-02-14: 750 mg via INTRAVENOUS
  Filled 2019-02-14: qty 750

## 2019-02-14 MED ORDER — TRAMADOL HCL 50 MG PO TABS
50.0000 mg | ORAL_TABLET | ORAL | Status: DC | PRN
  Administered 2019-02-15 – 2019-02-18 (×5): 50 mg via ORAL
  Filled 2019-02-14 (×6): qty 1

## 2019-02-14 MED ORDER — VANCOMYCIN HCL 1000 MG IV SOLR
INTRAVENOUS | Status: DC | PRN
  Administered 2019-02-14: 1000 mL

## 2019-02-14 MED ORDER — PHENYLEPHRINE HCL-NACL 20-0.9 MG/250ML-% IV SOLN: 0.0000 ug/min | INTRAVENOUS | Status: DC

## 2019-02-14 MED ORDER — CHLORHEXIDINE GLUCONATE 0.12 % MT SOLN
15.0000 mL | OROMUCOSAL | Status: DC
  Filled 2019-02-14: qty 15

## 2019-02-14 MED ORDER — HEPARIN SODIUM (PORCINE) 1000 UNIT/ML IJ SOLN
INTRAMUSCULAR | Status: AC
  Filled 2019-02-14: qty 1

## 2019-02-14 MED ORDER — ASPIRIN 81 MG PO CHEW: 324.0000 mg | CHEWABLE_TABLET | Freq: Every day | ORAL | Status: DC

## 2019-02-14 MED ORDER — DOPAMINE-DEXTROSE 3.2-5 MG/ML-% IV SOLN
0.0000 ug/kg/min | INTRAVENOUS | Status: DC
  Filled 2019-02-14: qty 250

## 2019-02-14 MED ORDER — TRANEXAMIC ACID 1000 MG/10ML IV SOLN
1.5000 mg/kg/h | INTRAVENOUS | Status: AC
  Administered 2019-02-14: 1.5 mg/kg/h via INTRAVENOUS
  Filled 2019-02-14: qty 25

## 2019-02-14 MED ORDER — ROCURONIUM BROMIDE 10 MG/ML (PF) SYRINGE
PREFILLED_SYRINGE | INTRAVENOUS | Status: AC
  Filled 2019-02-14: qty 10

## 2019-02-14 MED ORDER — SODIUM CHLORIDE (PF) 0.9 % IJ SOLN
INTRAMUSCULAR | Status: AC
  Filled 2019-02-14: qty 10

## 2019-02-14 MED ORDER — MUPIROCIN 2 % EX OINT
1.0000 "application " | TOPICAL_OINTMENT | Freq: Two times a day (BID) | CUTANEOUS | Status: AC
  Administered 2019-02-14 – 2019-02-18 (×6): 1 via NASAL
  Filled 2019-02-14 (×4): qty 22

## 2019-02-14 MED ORDER — LACTATED RINGERS IV SOLN
INTRAVENOUS | Status: DC | PRN
  Administered 2019-02-14 (×2): via INTRAVENOUS

## 2019-02-14 MED ORDER — BISACODYL 5 MG PO TBEC
10.0000 mg | DELAYED_RELEASE_TABLET | Freq: Every day | ORAL | Status: DC
  Administered 2019-02-15 – 2019-02-16 (×2): 10 mg via ORAL
  Filled 2019-02-14 (×3): qty 2

## 2019-02-14 MED ORDER — SODIUM CHLORIDE 0.9 % IV SOLN
1.5000 g | INTRAVENOUS | Status: AC
  Administered 2019-02-14: 1.5 g via INTRAVENOUS
  Filled 2019-02-14: qty 1.5

## 2019-02-14 MED ORDER — LACTATED RINGERS IV SOLN: INTRAVENOUS | Status: DC

## 2019-02-14 MED ORDER — METOPROLOL TARTRATE 25 MG PO TABS
25.0000 mg | ORAL_TABLET | Freq: Two times a day (BID) | ORAL | Status: DC
  Administered 2019-02-14: 25 mg via ORAL
  Filled 2019-02-14: qty 1

## 2019-02-14 MED ORDER — FENTANYL CITRATE (PF) 250 MCG/5ML IJ SOLN
INTRAMUSCULAR | Status: AC
  Filled 2019-02-14: qty 5

## 2019-02-14 MED ORDER — SODIUM CHLORIDE 0.9 % IV SOLN
INTRAVENOUS | Status: DC
  Filled 2019-02-14: qty 30

## 2019-02-14 MED ORDER — FENTANYL CITRATE (PF) 250 MCG/5ML IJ SOLN
INTRAMUSCULAR | Status: AC
  Filled 2019-02-14: qty 20

## 2019-02-14 MED ORDER — MIDAZOLAM HCL (PF) 10 MG/2ML IJ SOLN
INTRAMUSCULAR | Status: AC
  Filled 2019-02-14: qty 2

## 2019-02-14 MED ORDER — DOCUSATE SODIUM 100 MG PO CAPS
200.0000 mg | ORAL_CAPSULE | Freq: Every day | ORAL | Status: DC
  Administered 2019-02-15 – 2019-02-17 (×3): 200 mg via ORAL
  Filled 2019-02-14 (×4): qty 2

## 2019-02-14 MED ORDER — LACTATED RINGERS IV SOLN
INTRAVENOUS | Status: DC | PRN
  Administered 2019-02-14: 14:00:00 via INTRAVENOUS

## 2019-02-14 MED ORDER — MIDAZOLAM HCL 2 MG/2ML IJ SOLN: 2.0000 mg | INTRAMUSCULAR | Status: DC | PRN

## 2019-02-14 MED ORDER — NICARDIPINE HCL IN NACL 20-0.86 MG/200ML-% IV SOLN: 3.0000 mg/h | INTRAVENOUS | Status: DC

## 2019-02-14 MED ORDER — ALBUMIN HUMAN 5 % IV SOLN
250.0000 mL | INTRAVENOUS | Status: AC | PRN
  Administered 2019-02-14 (×2): 12.5 g via INTRAVENOUS

## 2019-02-14 MED ORDER — PROPOFOL 10 MG/ML IV BOLUS
INTRAVENOUS | Status: DC | PRN
  Administered 2019-02-14: 20 mg via INTRAVENOUS
  Administered 2019-02-14: 50 mg via INTRAVENOUS
  Administered 2019-02-14: 10 mg via INTRAVENOUS
  Administered 2019-02-14: 30 mg via INTRAVENOUS

## 2019-02-14 MED ORDER — METOPROLOL TARTRATE 25 MG/10 ML ORAL SUSPENSION: 12.5000 mg | Freq: Two times a day (BID) | ORAL | Status: DC

## 2019-02-14 MED ORDER — VANCOMYCIN HCL IN DEXTROSE 1-5 GM/200ML-% IV SOLN
1000.0000 mg | Freq: Once | INTRAVENOUS | Status: AC
  Administered 2019-02-15: 1000 mg via INTRAVENOUS
  Filled 2019-02-14: qty 200

## 2019-02-14 MED ORDER — 0.9 % SODIUM CHLORIDE (POUR BTL) OPTIME
TOPICAL | Status: DC | PRN
  Administered 2019-02-14: 3000 mL
  Administered 2019-02-14: 5000 mL

## 2019-02-14 MED ORDER — PROTAMINE SULFATE 10 MG/ML IV SOLN
INTRAVENOUS | Status: DC | PRN
  Administered 2019-02-14: 20 mg via INTRAVENOUS
  Administered 2019-02-14: 250 mg via INTRAVENOUS

## 2019-02-14 MED ORDER — SODIUM CHLORIDE 0.45 % IV SOLN
INTRAVENOUS | Status: DC | PRN
  Administered 2019-02-14: 18:00:00 via INTRAVENOUS

## 2019-02-14 MED ORDER — PHENYLEPHRINE HCL-NACL 20-0.9 MG/250ML-% IV SOLN
30.0000 ug/min | INTRAVENOUS | Status: AC
  Administered 2019-02-14: 50 ug/min via INTRAVENOUS
  Filled 2019-02-14: qty 250

## 2019-02-14 MED ORDER — ARTIFICIAL TEARS OPHTHALMIC OINT
TOPICAL_OINTMENT | OPHTHALMIC | Status: AC
  Filled 2019-02-14: qty 3.5

## 2019-02-14 MED ORDER — PANTOPRAZOLE SODIUM 40 MG PO TBEC
40.0000 mg | DELAYED_RELEASE_TABLET | Freq: Every day | ORAL | Status: DC
  Administered 2019-02-16 – 2019-02-19 (×4): 40 mg via ORAL
  Filled 2019-02-14 (×4): qty 1

## 2019-02-14 MED ORDER — SODIUM CHLORIDE 0.9 % IV SOLN: 250.0000 mL | INTRAVENOUS | Status: DC

## 2019-02-14 MED ORDER — SODIUM CHLORIDE 0.9% FLUSH
3.0000 mL | Freq: Two times a day (BID) | INTRAVENOUS | Status: DC
  Administered 2019-02-15 – 2019-02-18 (×7): 3 mL via INTRAVENOUS

## 2019-02-14 MED ORDER — INSULIN REGULAR(HUMAN) IN NACL 100-0.9 UT/100ML-% IV SOLN
INTRAVENOUS | Status: AC
  Administered 2019-02-14: 1 [IU]/h via INTRAVENOUS
  Filled 2019-02-14: qty 100

## 2019-02-14 MED ORDER — ACETAMINOPHEN 160 MG/5ML PO SOLN: 650.0000 mg | Freq: Once | ORAL | Status: AC

## 2019-02-14 MED ORDER — SODIUM CHLORIDE 0.9 % IV SOLN
INTRAVENOUS | Status: DC | PRN
  Administered 2019-02-14: 17:00:00 via INTRAVENOUS

## 2019-02-14 MED ORDER — MILRINONE LACTATE IN DEXTROSE 20-5 MG/100ML-% IV SOLN
0.3000 ug/kg/min | INTRAVENOUS | Status: DC
  Filled 2019-02-14: qty 100

## 2019-02-14 MED ORDER — HEMOSTATIC AGENTS (NO CHARGE) OPTIME
TOPICAL | Status: DC | PRN
  Administered 2019-02-14 (×2): 1 via TOPICAL

## 2019-02-14 MED ORDER — SODIUM CHLORIDE 0.9 % IV SOLN
1.5000 g | Freq: Two times a day (BID) | INTRAVENOUS | Status: AC
  Administered 2019-02-14 – 2019-02-16 (×4): 1.5 g via INTRAVENOUS
  Filled 2019-02-14 (×4): qty 1.5

## 2019-02-14 MED ORDER — HEPARIN SODIUM (PORCINE) 1000 UNIT/ML IJ SOLN
INTRAMUSCULAR | Status: DC | PRN
  Administered 2019-02-14: 27000 [IU] via INTRAVENOUS

## 2019-02-14 MED ORDER — LACTATED RINGERS IV SOLN
Freq: Once | INTRAVENOUS | Status: AC
  Administered 2019-02-14: 13:00:00 via INTRAVENOUS

## 2019-02-14 MED ORDER — SODIUM CHLORIDE (PF) 0.9 % IJ SOLN
OROMUCOSAL | Status: DC | PRN
  Administered 2019-02-14 (×3): 4 mL via TOPICAL

## 2019-02-14 MED ORDER — TRANEXAMIC ACID (OHS) PUMP PRIME SOLUTION
2.0000 mg/kg | INTRAVENOUS | Status: DC
  Filled 2019-02-14: qty 1.53

## 2019-02-14 MED ORDER — SODIUM CHLORIDE 0.9% FLUSH: 3.0000 mL | INTRAVENOUS | Status: DC | PRN

## 2019-02-14 MED ORDER — LIDOCAINE 2% (20 MG/ML) 5 ML SYRINGE
INTRAMUSCULAR | Status: AC
  Filled 2019-02-14: qty 5

## 2019-02-14 MED ORDER — VANCOMYCIN HCL 10 G IV SOLR
1250.0000 mg | INTRAVENOUS | Status: AC
  Administered 2019-02-14: 1250 mg via INTRAVENOUS
  Filled 2019-02-14: qty 1250

## 2019-02-14 MED ORDER — NICARDIPINE HCL IN NACL 20-0.86 MG/200ML-% IV SOLN
3.0000 mg/h | INTRAVENOUS | Status: DC
  Administered 2019-02-14: 5 mg/h via INTRAVENOUS
  Administered 2019-02-14: 6 mg/h via INTRAVENOUS
  Filled 2019-02-14 (×2): qty 200

## 2019-02-14 MED ORDER — VANCOMYCIN HCL 1000 MG IV SOLR
INTRAVENOUS | Status: AC
  Filled 2019-02-14: qty 3000

## 2019-02-14 MED ORDER — NITROGLYCERIN IN D5W 200-5 MCG/ML-% IV SOLN
2.0000 ug/min | INTRAVENOUS | Status: AC
  Administered 2019-02-14: 30 ug/min via INTRAVENOUS
  Filled 2019-02-14: qty 250

## 2019-02-14 MED ORDER — EPHEDRINE 5 MG/ML INJ
INTRAVENOUS | Status: AC
  Filled 2019-02-14: qty 10

## 2019-02-14 MED ORDER — PHENYLEPHRINE 40 MCG/ML (10ML) SYRINGE FOR IV PUSH (FOR BLOOD PRESSURE SUPPORT)
PREFILLED_SYRINGE | INTRAVENOUS | Status: AC
  Filled 2019-02-14: qty 10

## 2019-02-14 MED ORDER — SUCCINYLCHOLINE CHLORIDE 200 MG/10ML IV SOSY
PREFILLED_SYRINGE | INTRAVENOUS | Status: AC
  Filled 2019-02-14: qty 10

## 2019-02-14 MED ORDER — METOPROLOL TARTRATE 5 MG/5ML IV SOLN
2.5000 mg | INTRAVENOUS | Status: DC | PRN
  Administered 2019-02-16: 2.5 mg via INTRAVENOUS
  Filled 2019-02-14: qty 5

## 2019-02-14 MED ORDER — ACETAMINOPHEN 160 MG/5ML PO SOLN: 1000.0000 mg | Freq: Four times a day (QID) | ORAL | Status: DC

## 2019-02-14 MED ORDER — CHLORHEXIDINE GLUCONATE CLOTH 2 % EX PADS
6.0000 | MEDICATED_PAD | Freq: Every day | CUTANEOUS | Status: DC
  Administered 2019-02-14 – 2019-02-17 (×4): 6 via TOPICAL

## 2019-02-14 MED ORDER — METOPROLOL TARTRATE 12.5 MG HALF TABLET
12.5000 mg | ORAL_TABLET | Freq: Two times a day (BID) | ORAL | Status: DC
  Administered 2019-02-15: 12.5 mg via ORAL
  Filled 2019-02-14 (×2): qty 1

## 2019-02-14 MED ORDER — PROTAMINE SULFATE 10 MG/ML IV SOLN
INTRAVENOUS | Status: AC
  Filled 2019-02-14: qty 25

## 2019-02-14 MED ORDER — FENTANYL CITRATE (PF) 250 MCG/5ML IJ SOLN
INTRAMUSCULAR | Status: DC | PRN
  Administered 2019-02-14: 50 ug via INTRAVENOUS
  Administered 2019-02-14: 100 ug via INTRAVENOUS
  Administered 2019-02-14: 50 ug via INTRAVENOUS
  Administered 2019-02-14: 25 ug via INTRAVENOUS
  Administered 2019-02-14: 150 ug via INTRAVENOUS
  Administered 2019-02-14 (×2): 50 ug via INTRAVENOUS
  Administered 2019-02-14: 25 ug via INTRAVENOUS
  Administered 2019-02-14: 600 ug via INTRAVENOUS
  Administered 2019-02-14: 150 ug via INTRAVENOUS

## 2019-02-14 MED ORDER — POTASSIUM CHLORIDE 2 MEQ/ML IV SOLN
80.0000 meq | INTRAVENOUS | Status: DC
  Filled 2019-02-14: qty 40

## 2019-02-14 MED ORDER — OXYCODONE HCL 5 MG PO TABS
5.0000 mg | ORAL_TABLET | ORAL | Status: DC | PRN
  Administered 2019-02-15 (×4): 5 mg via ORAL
  Administered 2019-02-15 – 2019-02-17 (×3): 10 mg via ORAL
  Filled 2019-02-14: qty 2
  Filled 2019-02-14: qty 1
  Filled 2019-02-14 (×2): qty 2
  Filled 2019-02-14: qty 1
  Filled 2019-02-14: qty 2
  Filled 2019-02-14: qty 1

## 2019-02-14 MED ORDER — SODIUM CHLORIDE 0.9 % IV SOLN
INTRAVENOUS | Status: DC
  Administered 2019-02-14: 19:00:00 via INTRAVENOUS

## 2019-02-14 MED ORDER — FAMOTIDINE IN NACL 20-0.9 MG/50ML-% IV SOLN
20.0000 mg | Freq: Two times a day (BID) | INTRAVENOUS | Status: AC
  Administered 2019-02-14 (×2): 20 mg via INTRAVENOUS
  Filled 2019-02-14: qty 50

## 2019-02-14 MED ORDER — TRANEXAMIC ACID (OHS) BOLUS VIA INFUSION
15.0000 mg/kg | INTRAVENOUS | Status: AC
  Administered 2019-02-14: 1149 mg via INTRAVENOUS
  Filled 2019-02-14: qty 1149

## 2019-02-14 MED ORDER — PLASMA-LYTE 148 IV SOLN
INTRAVENOUS | Status: AC
  Administered 2019-02-14: 500 mL
  Filled 2019-02-14: qty 2.5

## 2019-02-14 MED ORDER — MORPHINE SULFATE (PF) 2 MG/ML IV SOLN
1.0000 mg | INTRAVENOUS | Status: DC | PRN
  Administered 2019-02-14 – 2019-02-15 (×4): 2 mg via INTRAVENOUS
  Filled 2019-02-14 (×4): qty 1

## 2019-02-14 MED ORDER — VANCOMYCIN HCL 1000 MG IV SOLR
INTRAVENOUS | Status: DC
  Filled 2019-02-14: qty 1000

## 2019-02-14 MED ORDER — FENTANYL CITRATE (PF) 100 MCG/2ML IJ SOLN
50.0000 ug | Freq: Once | INTRAMUSCULAR | Status: AC
  Administered 2019-02-14: 13:00:00 50 ug via INTRAVENOUS

## 2019-02-14 MED ORDER — PROPOFOL 10 MG/ML IV BOLUS
INTRAVENOUS | Status: AC
  Filled 2019-02-14: qty 20

## 2019-02-14 MED ORDER — FENTANYL CITRATE (PF) 100 MCG/2ML IJ SOLN
INTRAMUSCULAR | Status: AC
  Administered 2019-02-14: 50 ug via INTRAVENOUS
  Filled 2019-02-14: qty 2

## 2019-02-14 MED ORDER — INSULIN REGULAR(HUMAN) IN NACL 100-0.9 UT/100ML-% IV SOLN: INTRAVENOUS | Status: DC

## 2019-02-14 MED ORDER — BISACODYL 10 MG RE SUPP: 10.0000 mg | Freq: Every day | RECTAL | Status: DC

## 2019-02-14 MED ORDER — ROCURONIUM BROMIDE 10 MG/ML (PF) SYRINGE
PREFILLED_SYRINGE | INTRAVENOUS | Status: DC | PRN
  Administered 2019-02-14: 40 mg via INTRAVENOUS
  Administered 2019-02-14: 20 mg via INTRAVENOUS
  Administered 2019-02-14 (×2): 50 mg via INTRAVENOUS
  Administered 2019-02-14: 60 mg via INTRAVENOUS

## 2019-02-14 MED ORDER — DEXMEDETOMIDINE HCL IN NACL 400 MCG/100ML IV SOLN
0.1000 ug/kg/h | INTRAVENOUS | Status: AC
  Administered 2019-02-14: .3 ug/kg/h via INTRAVENOUS
  Filled 2019-02-14: qty 100

## 2019-02-14 MED ORDER — MAGNESIUM SULFATE 4 GM/100ML IV SOLN
4.0000 g | Freq: Once | INTRAVENOUS | Status: AC
  Administered 2019-02-14: 4 g via INTRAVENOUS
  Filled 2019-02-14: qty 100

## 2019-02-14 MED ORDER — MIDAZOLAM HCL 2 MG/2ML IJ SOLN
INTRAMUSCULAR | Status: AC
  Administered 2019-02-14: 1 mg via INTRAVENOUS
  Filled 2019-02-14: qty 2

## 2019-02-14 MED ORDER — INSULIN REGULAR BOLUS VIA INFUSION
0.0000 [IU] | Freq: Three times a day (TID) | INTRAVENOUS | Status: DC
  Filled 2019-02-14: qty 10

## 2019-02-14 MED ORDER — ASPIRIN EC 325 MG PO TBEC
325.0000 mg | DELAYED_RELEASE_TABLET | Freq: Every day | ORAL | Status: DC
  Administered 2019-02-15 – 2019-02-19 (×5): 325 mg via ORAL
  Filled 2019-02-14 (×5): qty 1

## 2019-02-14 MED ORDER — ACETAMINOPHEN 500 MG PO TABS
1000.0000 mg | ORAL_TABLET | Freq: Four times a day (QID) | ORAL | Status: DC
  Administered 2019-02-15 – 2019-02-19 (×15): 1000 mg via ORAL
  Filled 2019-02-14 (×15): qty 2

## 2019-02-14 MED ORDER — LACTATED RINGERS IV SOLN: 500.0000 mL | Freq: Once | INTRAVENOUS | Status: DC | PRN

## 2019-02-14 MED ORDER — MIDAZOLAM HCL 5 MG/5ML IJ SOLN
INTRAMUSCULAR | Status: DC | PRN
  Administered 2019-02-14: 2 mg via INTRAVENOUS
  Administered 2019-02-14: 1 mg via INTRAVENOUS
  Administered 2019-02-14: 2 mg via INTRAVENOUS
  Administered 2019-02-14: 5 mg via INTRAVENOUS

## 2019-02-14 MED ORDER — EPINEPHRINE HCL 5 MG/250ML IV SOLN IN NS
0.0000 ug/min | INTRAVENOUS | Status: AC
  Administered 2019-02-14: 1 ug/min via INTRAVENOUS
  Filled 2019-02-14: qty 250

## 2019-02-14 MED ORDER — NITROGLYCERIN IN D5W 200-5 MCG/ML-% IV SOLN: 0.0000 ug/min | INTRAVENOUS | Status: DC

## 2019-02-14 MED ORDER — ONDANSETRON HCL 4 MG/2ML IJ SOLN: 4.0000 mg | Freq: Four times a day (QID) | INTRAMUSCULAR | Status: DC | PRN

## 2019-02-14 MED ORDER — SUCCINYLCHOLINE CHLORIDE 20 MG/ML IJ SOLN
INTRAMUSCULAR | Status: DC | PRN
  Administered 2019-02-14: 100 mg via INTRAVENOUS

## 2019-02-14 MED ORDER — MIDAZOLAM HCL 2 MG/2ML IJ SOLN
1.0000 mg | Freq: Once | INTRAMUSCULAR | Status: AC
  Administered 2019-02-14: 13:00:00 1 mg via INTRAVENOUS

## 2019-02-14 SURGICAL SUPPLY — 89 items
ADAPTER CARDIO PERF ANTE/RETRO (ADAPTER) ×3 IMPLANT
BAG DECANTER FOR FLEXI CONT (MISCELLANEOUS) ×6 IMPLANT
BASKET HEART (ORDER IN 25'S) (MISCELLANEOUS) ×1
BASKET HEART (ORDER IN 25S) (MISCELLANEOUS) ×2 IMPLANT
BLADE CLIPPER SURG (BLADE) ×3 IMPLANT
BLADE STERNUM SYSTEM 6 (BLADE) ×3 IMPLANT
BNDG ELASTIC 4X5.8 VLCR STR LF (GAUZE/BANDAGES/DRESSINGS) ×3 IMPLANT
BNDG ELASTIC 6X15 VLCR STRL LF (GAUZE/BANDAGES/DRESSINGS) ×3 IMPLANT
BNDG ELASTIC 6X5.8 VLCR STR LF (GAUZE/BANDAGES/DRESSINGS) ×3 IMPLANT
BNDG GAUZE ELAST 4 BULKY (GAUZE/BANDAGES/DRESSINGS) ×3 IMPLANT
CANISTER SUCT 3000ML PPV (MISCELLANEOUS) ×3 IMPLANT
CATH CPB KIT HENDRICKSON (MISCELLANEOUS) ×3 IMPLANT
CATH RETROPLEGIA CORONARY 14FR (CATHETERS) ×3 IMPLANT
CATH ROBINSON RED A/P 18FR (CATHETERS) ×6 IMPLANT
CLIP RETRACTION 3.0MM CORONARY (MISCELLANEOUS) ×3 IMPLANT
COVER WAND RF STERILE (DRAPES) ×3 IMPLANT
DERMABOND ADVANCED (GAUZE/BANDAGES/DRESSINGS) ×1
DERMABOND ADVANCED .7 DNX12 (GAUZE/BANDAGES/DRESSINGS) ×2 IMPLANT
DRAIN CHANNEL 28F RND 3/8 FF (WOUND CARE) ×9 IMPLANT
DRAPE CARDIOVASCULAR INCISE (DRAPES) ×1
DRAPE SLUSH/WARMER DISC (DRAPES) ×6 IMPLANT
DRAPE SRG 135X102X78XABS (DRAPES) ×2 IMPLANT
DRSG AQUACEL AG ADV 3.5X14 (GAUZE/BANDAGES/DRESSINGS) ×3 IMPLANT
ELECT CAUTERY BLADE 6.4 (BLADE) ×3 IMPLANT
ELECT REM PT RETURN 9FT ADLT (ELECTROSURGICAL) ×6
ELECTRODE REM PT RTRN 9FT ADLT (ELECTROSURGICAL) ×4 IMPLANT
FELT TEFLON 1X6 (MISCELLANEOUS) ×6 IMPLANT
GAUZE SPONGE 4X4 12PLY STRL (GAUZE/BANDAGES/DRESSINGS) ×6 IMPLANT
GLOVE BIO SURGEON STRL SZ 6.5 (GLOVE) ×6 IMPLANT
GLOVE BIOGEL PI IND STRL 6 (GLOVE) ×2 IMPLANT
GLOVE BIOGEL PI INDICATOR 6 (GLOVE) ×1
GLOVE NEODERM STRL 7.5 LF PF (GLOVE) ×6 IMPLANT
GLOVE SURG NEODERM 7.5  LF PF (GLOVE) ×3
GLOVE SURG SS PI 7.5 STRL IVOR (GLOVE) ×3 IMPLANT
GOWN STRL REUS W/ TWL LRG LVL3 (GOWN DISPOSABLE) ×10 IMPLANT
GOWN STRL REUS W/TWL LRG LVL3 (GOWN DISPOSABLE) ×5
HEMOSTAT POWDER SURGIFOAM 1G (HEMOSTASIS) ×9 IMPLANT
HEMOSTAT SURGICEL 2X14 (HEMOSTASIS) ×6 IMPLANT
INSERT FOGARTY XLG (MISCELLANEOUS) ×3 IMPLANT
IV CATH 18G X1.75 CATHLON (IV SOLUTION) ×3 IMPLANT
KIT BASIN OR (CUSTOM PROCEDURE TRAY) ×3 IMPLANT
KIT SUCTION CATH 14FR (SUCTIONS) ×3 IMPLANT
KIT TURNOVER KIT B (KITS) ×3 IMPLANT
KIT VASOVIEW HEMOPRO 2 VH 4000 (KITS) ×3 IMPLANT
LEAD PACING MYOCARDI (MISCELLANEOUS) ×3 IMPLANT
MARKER GRAFT CORONARY BYPASS (MISCELLANEOUS) ×9 IMPLANT
NEEDLE 18GX1X1/2 (RX/OR ONLY) (NEEDLE) ×3 IMPLANT
NS IRRIG 1000ML POUR BTL (IV SOLUTION) ×24 IMPLANT
PACK E OPEN HEART (SUTURE) ×3 IMPLANT
PACK OPEN HEART (CUSTOM PROCEDURE TRAY) ×3 IMPLANT
PACK SPY-PHI (KITS) IMPLANT
PAD ARMBOARD 7.5X6 YLW CONV (MISCELLANEOUS) ×6 IMPLANT
PAD ELECT DEFIB RADIOL ZOLL (MISCELLANEOUS) ×3 IMPLANT
PENCIL BUTTON HOLSTER BLD 10FT (ELECTRODE) ×3 IMPLANT
POSITIONER HEAD DONUT 9IN (MISCELLANEOUS) ×3 IMPLANT
PUNCH AORTIC ROTATE  4.5MM 8IN (MISCELLANEOUS) ×3 IMPLANT
SET CARDIOPLEGIA MPS 5001102 (MISCELLANEOUS) ×3 IMPLANT
SUT BONE WAX W31G (SUTURE) ×3 IMPLANT
SUT MNCRL AB 3-0 PS2 18 (SUTURE) ×6 IMPLANT
SUT PDS AB 1 CTX 36 (SUTURE) ×6 IMPLANT
SUT PROLENE 3 0 SH DA (SUTURE) ×3 IMPLANT
SUT PROLENE 4 0 RB 1 (SUTURE) ×1
SUT PROLENE 4-0 RB1 .5 CRCL 36 (SUTURE) ×2 IMPLANT
SUT PROLENE 5 0 C 1 36 (SUTURE) IMPLANT
SUT PROLENE 6 0 C 1 30 (SUTURE) ×9 IMPLANT
SUT PROLENE 8 0 BV175 6 (SUTURE) IMPLANT
SUT PROLENE BLUE 7 0 (SUTURE) ×3 IMPLANT
SUT SILK  1 MH (SUTURE) ×1
SUT SILK 1 MH (SUTURE) ×2 IMPLANT
SUT SILK 2 0 SH CR/8 (SUTURE) IMPLANT
SUT SILK 3 0 SH CR/8 (SUTURE) IMPLANT
SUT STEEL 6MS V (SUTURE) ×3 IMPLANT
SUT STEEL SZ 6 DBL 3X14 BALL (SUTURE) ×3 IMPLANT
SUT VIC AB 2-0 CTX 27 (SUTURE) IMPLANT
SUT VIC AB 3-0 X1 27 (SUTURE) IMPLANT
SYR 10ML LL (SYRINGE) IMPLANT
SYR 30ML LL (SYRINGE) ×3 IMPLANT
SYR 3ML LL SCALE MARK (SYRINGE) ×3 IMPLANT
SYR BULB IRRIGATION 50ML (SYRINGE) ×3 IMPLANT
SYSTEM SAHARA CHEST DRAIN ATS (WOUND CARE) ×3 IMPLANT
TAPE CLOTH SOFT 2X10 (GAUZE/BANDAGES/DRESSINGS) ×3 IMPLANT
TAPE CLOTH SURG 4X10 WHT LF (GAUZE/BANDAGES/DRESSINGS) ×3 IMPLANT
TOWEL GREEN STERILE (TOWEL DISPOSABLE) ×3 IMPLANT
TOWEL GREEN STERILE FF (TOWEL DISPOSABLE) ×3 IMPLANT
TRAY FOLEY SLVR 16FR TEMP STAT (SET/KITS/TRAYS/PACK) ×3 IMPLANT
TUBING LAP HI FLOW INSUFFLATIO (TUBING) ×3 IMPLANT
UNDERPAD 30X30 (UNDERPADS AND DIAPERS) ×3 IMPLANT
WATER STERILE IRR 1000ML POUR (IV SOLUTION) ×6 IMPLANT
WATER STERILE IRR 1000ML UROMA (IV SOLUTION) IMPLANT

## 2019-02-14 NOTE — Significant Event (Addendum)
Rapid Response Event Note  Overview: Chest Pain  Initial Focused Assessment: Called by nursing staff with concerns of patient having sharp substernal chest pain coupled with SOB, CARDS team had already seen the patient and had already requested a CVICU bed. Upon arrival, [name] endorses having 5/10 pain, improved 10/10 after receiving Morphine IV and increasing NTG infusion. He has no longer SOB, talking comfortably, alert and oriented, skin warm and dry, HR SR 70s-80s, SBP 120-130s, MAP > 65. Currently on NTG 20 mcg/kg/min and Heparin Infusion at 10cc/hr. RN had already obtained EKG, reviewed by CARDS APP. Lung sounds - clear lung sounds, good air movement, 95% on 2L [place]. Pending Cardiothoracic Surgery evaluation.   Interventions: -- No RRT Interventions   Plan of Care: -- Transfer to 2H01 once bed is ready. CVTS MD came to bedside, plan is now to take [name] to Pre-Op holding area for urgent CABG. Chest pain is 3/10. I was called away to see another patient, OR to come and take patient down to pre-op.   Event Summary:  Call Time [place]  Hines Kloss R

## 2019-02-14 NOTE — Progress Notes (Signed)
[facility] for Heparin Indication: chest pain/ACS  Allergies  Allergen Reactions  . Hydrocodone-Acetaminophen Itching  . Temazepam Itching  . Trazodone And Nefazodone Itching    Patient Measurements: Height: 5\' 10"  (177.8 cm) Weight: 165 lb 11.2 oz (75.2 kg) IBW/kg (Calculated) : 73 Heparin Dosing Weight: TBW  Vital Signs: Temp: 98.3 F (36.8 C) (10/13 2349) Temp Source: Oral (10/13 2349) BP: 128/82 (10/13 2209) Pulse Rate: 84 (10/13 2209)  Labs: Recent Labs    02/12/19 1259 02/12/19 1328 02/12/19 1522 02/12/19 2116 02/12/19 2317  02/13/19 0233 02/13/19 0346 02/13/19 1120 02/14/19 0347  HGB 14.2 13.9  --   --   --   --  12.6*  --   --  12.6*  HCT 40.1 41.0  --   --   --   --  37.3*  --   --  36.8*  PLT 258  --   --   --   --   --  249  --   --  211  HEPARINUNFRC  --   --   --   --   --    < > 0.35 0.35 0.34 0.14*  CREATININE 1.25*  --   --   --   --   --  1.29*  --   --  1.15  TROPONINIHS 35*  --  75* 103* 97*  --   --   --   --   --    < > = values in this interval not displayed.    Estimated Creatinine Clearance: 61.7 mL/min (by C-G formula based on SCr of 1.15 mg/dL).  Assessment: 73 y.o. male with CAD s/p cath awaiting intervention for heparin   Goal of Therapy:  Heparin level 0.3-0.7 units/ml Monitor platelets by anticoagulation protocol: Yes   Plan:  Increase Heparin 1000 units/hr Check heparin level in 8 hours.  Phillis Knack, PharmD, BCPS

## 2019-02-14 NOTE — Anesthesia Procedure Notes (Signed)
Central Venous Catheter Insertion Performed by: Roderic Palau, MD, anesthesiologist Start/End10/14/2020 12:55 PM, 02/14/2019 1:10 PM Patient location: Pre-op. Preanesthetic checklist: patient identified, IV checked, site marked, risks and benefits discussed, surgical consent, monitors and equipment checked, pre-op evaluation, timeout performed and anesthesia consent Position: Trendelenburg Lidocaine 1% used for infiltration and patient sedated Hand hygiene performed , maximum sterile barriers used  and Seldinger technique used Catheter size: 9 Fr Total catheter length 10. Central line was placed.MAC introducer Swan type:thermodilution Procedure performed using ultrasound guided technique. Ultrasound Notes:anatomy identified, needle tip was noted to be adjacent to the nerve/plexus identified, no ultrasound evidence of intravascular and/or intraneural injection and image(s) printed for medical record Attempts: 1 Following insertion, line sutured, dressing applied and Biopatch. Post procedure assessment: blood return through all ports, free fluid flow and no air  Patient tolerated the procedure well with no immediate complications.

## 2019-02-14 NOTE — Progress Notes (Signed)
Patient complaining of midsternal "sharp" chest pain / non-radiating, which started after ambulating to BR for bowel movement.  Dr. Claiborne Billings in to assess.  MSO4 2 mg. Given IV and IV NTG increased to 20 mcg.  EKG completed.

## 2019-02-14 NOTE — Progress Notes (Addendum)
Progress Note  Patient Name: Roger [name] Date of Encounter: 02/14/2019  Primary Cardiologist: Shelva Majestic, MD   Subjective   Having chest pain and SOB and nausea when I went in room, he had not yet notified the nurse.  On IV NTG and IV Heparin.  Some nausea.    Inpatient Medications    Scheduled Meds: . aspirin  81 mg Oral Daily  . docusate sodium  100 mg Oral BID  . metoprolol tartrate  12.5 mg Oral BID  . rosuvastatin  40 mg Oral q1800  . sodium chloride flush  3 mL Intravenous Once  . sodium chloride flush  3 mL Intravenous Q12H  . sodium chloride flush  3 mL Intravenous Q12H  . sodium chloride flush  3 mL Intravenous Q12H   Continuous Infusions: . sodium chloride    . sodium chloride    . heparin 1,000 Units/hr (02/14/19 0443)  . nitroGLYCERIN 15 mcg/min (02/13/19 0755)  . sodium chloride     PRN Meds: sodium chloride, sodium chloride, influenza vaccine adjuvanted, methocarbamol, morphine injection, ondansetron **OR** ondansetron (ZOFRAN) IV, oxyCODONE, sodium chloride flush, sodium chloride flush   Vital Signs    Vitals:   02/13/19 2349 02/14/19 0052 02/14/19 0252 02/14/19 0528  BP:  119/71 130/81 (!) 148/92  Pulse:  83 84   Resp:  17 16 18   Temp: 98.3 F (36.8 C)   98.1 F (36.7 C)  TempSrc: Oral   Oral  SpO2:  98% 96% 97%  Weight:    76.6 kg  Height:        Intake/Output Summary (Last 24 hours) at 02/14/2019 0748 Last data filed at 02/14/2019 0610 Gross per 24 hour  Intake 1287.11 ml  Output 725 ml  Net 562.11 ml   Last 3 Weights 02/14/2019 02/13/2019 02/12/2019  Weight (lbs) 168 lb 14.4 oz 165 lb 11.2 oz 166 lb 6.4 oz  Weight (kg) 76.613 kg 75.161 kg 75.479 kg      Telemetry    SR - Personally Reviewed  ECG    SR with new ST changes ant  - Personally Reviewed  Physical Exam   GEN: + chest pain .   Neck: No JVD Cardiac: RRR, no murmurs, rubs, or gallops.  Respiratory: Clear to auscultation bilaterally. GI: Soft, nontender,  non-distended  MS: No edema; No deformity. Neuro:  Nonfocal  Psych: Normal affect   Labs    High Sensitivity Troponin:   Recent Labs  Lab 02/12/19 1259 02/12/19 1522 02/12/19 2116 02/12/19 2317  TROPONINIHS 35* 75* 103* 97*      Chemistry Recent Labs  Lab 02/12/19 1259 02/12/19 1328 02/13/19 0233 02/14/19 0347  NA 142 142 137 138  K 4.1 4.1 3.4* 4.6  CL 108  --  100 105  CO2 21*  --  25 25  GLUCOSE 104*  --  132* 116*  BUN 14  --  18 14  CREATININE 1.25*  --  1.29* 1.15  CALCIUM 9.4  --  9.3 9.3  GFRNONAA 58*  --  56* >60  GFRAA >60  --  >60 >60  ANIONGAP 13  --  12 8     Hematology Recent Labs  Lab 02/12/19 1259 02/12/19 1328 02/13/19 0233 02/14/19 0347  WBC 7.9  --  8.4 9.1  RBC 3.91*  --  3.63* 3.57*  HGB 14.2 13.9 12.6* 12.6*  HCT 40.1 41.0 37.3* 36.8*  MCV 102.6*  --  102.8* 103.1*  MCH 36.3*  --  34.7* 35.3*  MCHC 35.4  --  33.8 34.2  RDW 13.0  --  13.2 12.9  PLT 258  --  249 211    BNP Recent Labs  Lab 02/12/19 1259  BNP 59.6     DDimer No results for input(s): DDIMER in the last 168 hours.   Radiology    Ct Angio Chest Pe W And/or Wo Contrast  Result Date: 02/12/2019 CLINICAL DATA:  73 year old male with 2 weeks of intermittent substernal chest pain and subjective fever. EXAM: CT ANGIOGRAPHY CHEST WITH CONTRAST TECHNIQUE: Multidetector CT imaging of the chest was performed using the standard protocol during bolus administration of intravenous contrast. Multiplanar CT image reconstructions and MIPs were obtained to evaluate the vascular anatomy. CONTRAST:  136mL OMNIPAQUE IOHEXOL 350 MG/ML SOLN COMPARISON:  None. FINDINGS: Cardiovascular: Adequate opacification of the pulmonary arteries to the segmental level. No evidence of central filling defect to suggest pulmonary embolism. The heart is normal in size. No pericardial effusion. Calcifications visualized along the coronary arteries. Tortuous thoracic aorta with trace atherosclerotic  calcifications. No evidence of aneurysm. Mediastinum/Nodes: Unremarkable CT appearance of the thyroid gland. No suspicious mediastinal or hilar adenopathy. No soft tissue mediastinal mass. Moderately large hiatal hernia. Lungs/Pleura: Minimal dependent atelectasis. No suspicious pulmonary mass or nodule. No evidence of emphysema or fibrosis. No pleural effusion or pneumothorax. Upper Abdomen: Visualized upper abdominal organs are unremarkable. Musculoskeletal: No acute fracture or aggressive appearing lytic or blastic osseous lesion. Review of the MIP images confirms the above findings. IMPRESSION: 1. Negative for pulmonary embolus, pneumonia or other acute cardiopulmonary process. 2. Moderately large hiatal hernia. This could represent a source for intermittent substernal chest pain. 3. Mild aortic atherosclerotic calcifications. Aortic Atherosclerosis (ICD10-170.0) 4. Coronary artery calcifications are also present. Electronically Signed   By: Jacqulynn Cadet M.D.   On: 02/12/2019 15:25   Dg Chest Port 1 View  Result Date: 02/12/2019 CLINICAL DATA:  Shortness of breath and chest pain over the last day. EXAM: PORTABLE CHEST 1 VIEW COMPARISON:  None. FINDINGS: Cardiomegaly. Aortic atherosclerosis and tortuosity. Pulmonary venous hypertension, possibly with early interstitial edema. Question small effusion on the left. No acute bone finding. IMPRESSION: Probable acute congestive heart failure with cardiomegaly, pulmonary venous hypertension and early interstitial edema. Electronically Signed   By: Nelson Chimes M.D.   On: 02/12/2019 14:02    Cardiac Studies   02/13/19 cardiac cath  There is mild to moderate left ventricular systolic dysfunction.  LV end diastolic pressure is normal.  The left ventricular ejection fraction is ~40-45% by visual estimate. Anterior hypokinesis.  ----------------------------------------------  Ost LAD to Prox LAD lesion is 80% stenosed.  Prox LAD to Mid LAD lesion  is 70% stenosed with 50% stenosed side branch in 1st Diag.  Ramus lesion is 30% stenosed.  Otherwise normal coronaries   SUMMARY  Severe ostial and proximal LAD with extensive disease segment.  Otherwise normal ramus intermedius and dominant circumflex.  Excellent LAD and diagonal targets  Mildly reduced EF with anterior hypokinesis.  Normal LVEDP  RECOMMENDATIONS  Return to nursing unit, restart IV heparin 8 hours after sheath removal.  Discussed images with Dr. Claiborne Billings, who agrees that prior to considering relatively high risk ostial LAD with very long lesion to cover with stents, the best option will be to consider CVTS consultation with PCI as a backup option.  Continue aggressive restart medication.  Diagnostic Dominance: Left   Echo 02/13/19 IMPRESSIONS    1. Left ventricular ejection fraction, by visual estimation, is 40  to 45%. The left ventricle has mild to moderately decreased function. Normal left ventricular size. There is mildly increased left ventricular hypertrophy.  2. Left ventricular diastolic Doppler parameters are consistent with impaired relaxation pattern of LV diastolic filling.  3. Global right ventricle has normal systolic function.The right ventricular size is normal. No increase in right ventricular wall thickness.  4. Left atrial size was normal.  5. Right atrial size was normal.  6. The mitral valve is normal in structure. Trace mitral valve regurgitation.  7. The tricuspid valve is normal in structure. Tricuspid valve regurgitation is trivial.  8. The aortic valve is tricuspid Aortic valve regurgitation was not visualized by color flow Doppler.  9. Normal pulmonary artery systolic pressure. 10. The inferior vena cava is normal in size with greater than 50% respiratory variability, suggesting right atrial pressure of 3 mmHg.  FINDINGS  Left Ventricle: Left ventricular ejection fraction, by visual estimation, is 40 to 45%. The left ventricle  has mild to moderately decreased function. There is mildly increased left ventricular hypertrophy. Concentric left ventricular hypertrophy. Normal  left ventricular size. Spectral Doppler shows Left ventricular diastolic Doppler parameters are consistent with impaired relaxation pattern of LV diastolic filling. Normal left ventricular filling pressures.  Right Ventricle: The right ventricular size is normal. No increase in right ventricular wall thickness. Global RV systolic function is has normal systolic function. The tricuspid regurgitant velocity is 2.05 m/s, and with an assumed right atrial pressure  of 3 mmHg, the estimated right ventricular systolic pressure is normal at 19.8 mmHg.  Left Atrium: Left atrial size was normal in size.  Right Atrium: Right atrial size was normal in size  Pericardium: There is no evidence of pericardial effusion.  Mitral Valve: The mitral valve is normal in structure. Trace mitral valve regurgitation.  Tricuspid Valve: The tricuspid valve is normal in structure. Tricuspid valve regurgitation is trivial by color flow Doppler.  Aortic Valve: The aortic valve is tricuspid. Aortic valve regurgitation was not visualized by color flow Doppler.  Pulmonic Valve: The pulmonic valve was grossly normal. Pulmonic valve regurgitation is not visualized by color flow Doppler.  Aorta: The aortic root is normal in size and structure.  Venous: The inferior vena cava is normal in size with greater than 50% respiratory variability, suggesting right atrial pressure of 3 mmHg.  IAS/Shunts: The atrial septum is grossly normal.    Patient Profile     73 y.o. male with a PMH of borderline HTN, HLD, and chronic back pain who is being seen today for the evaluation of chest pain and SOB, cath on the 13th with ostial LAD lesion await TCTS consult.  Assessment & Plan    1. CP, DOE:  Unstable angina - mild trop elevation pk of 103 - CXR concerning for  CHF>>treated, fluid not seen on CT - no PE, but large HH seen as well as coronary calcifications - no volume overload on exam - continue ASA, high-dose statin, BB, heparin, IV NTG has not tolerated higher doses, just rec'd IV morphine,  heparin resumed after cath EKG with new changes, have called TCTS and they will send MD ASAP in mean time we will transfer to CCU, Dr. Claiborne Billings here.   2. Early CHF on CXR with EF 40-45%  - s/p Lasix 40 mg IV x 1  Neg 670  - follow volume, no S&S overload now  3. HTN:  - BP well controlled on current rx - continue nitrates, BB  4. HLD:  -  Crestor increased to 40 mg qd  5. Hypokalemia - after IV Lasix - supplemented now 4.6      For questions or updates, please contact [place] Please consult [email or web address] for contact info under    Signed, Cecilie Kicks, NP  02/14/2019, 7:48 AM    Patient seen and examined. Agree with assessment and plan.  Angiographic findings were reviewed with Dr. Ellyn Hack.  I had a discussion with the patient's daughter last evening.  With his high-grade 80% ostial LAD followed by a long segment of stenosis in a large caliber ramus immediate vessel arising just after the LAD takeoff from the left main it is felt that the best option is CABG revascularization rather than stenting due to risk of potentially jailing the ramus vessel.  The patient has developed recurrent anginal symptomatology this morning associated with new anterolateral ST abnormalities.  He is on IV nitroglycerin and heparin.  IV nitro has just been titrated to 20 mcg with plan to increase to 30 mcg and 5 minutes of chest pain persist.  Metoprolol has been increased to 25 mg twice a day from 12.5 mg twice a day.  Current heart rate 75 bpm.  Morphine has been administered.  He has stable hemodynamics.  The patient will be transferred to heart.  Cardiothoracic has been consulted who will see patient soon as possible today.  Troy Sine, MD, [facility] 02/14/2019  10:27 AM

## 2019-02-14 NOTE — Progress Notes (Signed)
RT reassessed pt for readiness to wean to next phase and  placed pt in next phase of heart wean. Pt able to follow commands/instructions from RT. Pt placed on CPAP/PSV 10/5 FIO2 40%. Pt respiratory status stable at this time. RT will continue to monitor.

## 2019-02-14 NOTE — H&P (Signed)
History and Physical Interval Note:  02/14/2019 1:34 PM  Roger [name]  has presented today for surgery, with the diagnosis of CAD.  The various methods of treatment have been discussed with the patient and family. After consideration of risks, benefits and other options for treatment, the patient has consented to  Procedure(s): CORONARY ARTERY BYPASS GRAFTING (CABG) (N/A) TRANSESOPHAGEAL ECHOCARDIOGRAM (TEE) (N/A) as a surgical intervention.  The patient's history has been reviewed, patient examined, no change in status, stable for surgery.  I have reviewed the patient's chart and labs.  Questions were answered to the patient's satisfaction.     [name]

## 2019-02-14 NOTE — Progress Notes (Signed)
  Echocardiogram Echocardiogram Transesophageal has been performed.  Roger [name] 02/14/2019, 2:15 PM

## 2019-02-14 NOTE — Op Note (Signed)
CARDIOTHORACIC SURGERY OPERATIVE NOTE  Date of Procedure: 02/14/2019  Preoperative Diagnosis: Severe Coronary Artery Disease involving LAD with unstable angina unresponsive to medical management  Postoperative Diagnosis: Same  Procedure:    Coronary Artery Bypass Grafting x 3  Left Internal Mammary Artery to Distal Left Anterior Descending Coronary Artery; Saphenous Vein Graft to Ramus Intermedius Coronary Artery; Sapheonous Vein Graft to  Diagonal Branch Coronary Artery, Endoscopic Vein Harvest from right Thigh  Surgeon: B. Murvin Natal, MD  Assistant: Macarthur Critchley PA-C  Anesthesia: get  Operative Findings:  Mildly depressed left ventricular systolic function  good quality left internal mammary artery conduit  good quality saphenous vein conduit  good quality target vessels for grafting    BRIEF CLINICAL NOTE AND INDICATIONS FOR SURGERY 73 yo man developed relatively sudden onset CP this week prompting ED evaluation. High sensitivity troponins were elevated and there was evidence for new CHF. LHC shows complex LAD disease best treated with CABG. He developed unremitting CP today; taken to OR urgently for CABG.    DETAILS OF THE OPERATIVE PROCEDURE  Preparation:  The patient is brought to the operating room on the above mentioned date and central monitoring was established by the anesthesia team including placement of Swan-Ganz catheter and radial arterial line. The patient is placed in the supine position on the operating table.  Intravenous antibiotics are administered. General endotracheal anesthesia is induced uneventfully. A Foley catheter is placed.  Baseline transesophageal echocardiogram was performed.  Findings were notable for mildly depressed LV EF.   The patient's chest, abdomen, both groins, and both lower extremities are prepared and draped in a sterile manner. A time out procedure is performed.   Surgical Approach and Conduit Harvest:  A median  sternotomy incision was performed and the left internal mammary artery is dissected from the chest wall and prepared for bypass grafting. The left internal mammary artery is notably good quality conduit. Simultaneously, the greater saphenous vein is obtained from the patient's right thigh using endoscopic vein harvest technique. The saphenous vein is notably good quality conduit. After removal of the saphenous vein, the small surgical incisions in the lower extremity are closed with absorbable suture. Following systemic heparinization, the left internal mammary artery was transected distally noted to have excellent flow.   Extracorporeal Cardiopulmonary Bypass and Myocardial Protection:  The pericardium is opened. The ascending aorta is normal in appearance. The ascending aorta and the right atrium are cannulated for cardiopulmonary bypass.  Adequate heparinization is verified.    The entire pre-bypass portion of the operation was notable for stable hemodynamics.  Cardiopulmonary bypass was begun and the surface of the heart is inspected. Distal target vessels are selected for coronary artery bypass grafting. A cardioplegia cannula is placed in the ascending aorta.   The patient is allowed to cool passively to 34 C systemic temperature.  The aortic cross clamp is applied and cold blood cardioplegia is delivered initially in an antegrade fashion through the aortic root.    Iced saline slush is applied for topical hypothermia.  The initial cardioplegic arrest is rapid with early diastolic arrest.  Repeat doses of cardioplegia are administered intermittently throughout the entire cross clamp portion of the operation through the aortic root, and through subsequently placed vein grafts in order to maintain completely flat electrocardiogram.  Myocardial protection was felt to be  excellent.   Coronary Artery Bypass Grafting:   The ramus intermedius coronary artery was grafted using a reversed saphenous  vein graft in an end-to-side fashion.  At the site of distal anastomosis the target vessel was good quality and measured approximately 2 mm in diameter.  The  diagonal branch of the left anterior descending coronary artery was grafted using a reversed saphenous vein graft in an end-to-side fashion.  At the site of distal anastomosis the target vessel was good quality and measured approximately 2 mm in diameter.  The distal left anterior coronary artery was grafted with the left internal mammary artery in an end-to-side fashion.  At the site of distal anastomosis the target vessel was good quality and measured approximately 1.5 mm in diameter.  All proximal vein graft anastomoses were placed directly to the ascending aorta prior to removal of the aortic cross clamp.  The aortic cross clamp was removed after a total cross clamp time of 61 minutes.   Procedure Completion:  All proximal and distal coronary anastomoses were inspected for hemostasis and appropriate graft orientation. Epicardial pacing wires are fixed to the right ventricular outflow tract and to the right atrial appendage. The patient is rewarmed to 37C temperature. The patient is weaned and disconnected from cardiopulmonary bypass.  The patient's rhythm at separation from bypass was NSR.  The patient was weaned from cardiopulmonary bypass without any inotropic support. Total cardiopulmonary bypass time for the operation was 70minutes.   The aortic and venous cannula were removed uneventfully. Protamine was administered to reverse the anticoagulation. The mediastinum and pleural space were inspected for hemostasis and irrigated with saline solution. The mediastinum and left pleural space were drained using fluted chest tubes placed through separate stab incisions inferiorly.  The soft tissues anterior to the aorta were reapproximated loosely. The sternum is closed with double strength sternal wire. The soft tissues anterior to the sternum  were closed in multiple layers and the skin is closed with a running subcuticular skin closure.  The post-bypass portion of the operation was notable for stable rhythm and hemodynamics.  No blood products were administered during the operation.   Disposition:  The patient tolerated the procedure well and is transported to the surgical intensive care in stable condition. There are no intraoperative complications. All sponge instrument and needle counts are verified correct at completion of the operation.    Jayme Cloud, MD 02/14/2019 7:29 PM

## 2019-02-14 NOTE — Progress Notes (Addendum)
PROGRESS NOTE    Roger [name]  [id number] DOB: May 01, 1946 DOA: 02/12/2019 PCP: Rosine Door, MD   Brief Narrative:  Roger [name] is a 73 year old male with no significant past medical history who presents with chest pain going on for 1 week.  It has been associated with shortness of breath, nausea without vomiting.  Work-up in the emergency department revealed elevated troponin.  Patient was started on IV nitro, IV heparin, cardiology consulted. Assessment & Plan   Chest pain/ Unstable angina  -Troponin peaked at 103 and trended downward -Cardiology consulted -Cardiac catheterization: Severe ostial and proximal LAD with extensive disease segment.  Otherwise normal ramus intermedius and dominant circumflex.  Excellent LAD and diagonal targets.  Mildly reduced EF with anterior hypokinesis.  Normal LVEDP. -Cardiothoracic surgery consultation was pending -Patient with continued chest pain, had episode at approximately 4 AM this morning. -Continue aspirin, IV heparin, IV nitro -Patient now going for urgent CABG  Cardiomegaly -BNP 59.6 -Echocardiogram EF 40-45%.  LV is mild to moderately decreased function.  Mildly increased LV hypertrophy.  Impaired relaxation of the LV diastolic filling. -Patient received one-time dose IV Lasix -Does not appear to be fluid overloaded on examination  Hypokalemia -resolved, continue to monitor BMP  CKD stage III -Baseline creatinine 1.26 in December 2019, per care everywhere -Stable, currently 1.15  DVT Prophylaxis  IV heparin  Code Status: Full  Family Communication: None at bedside  Disposition Plan: Admitted. Pending CTS.   Consultants Cardiology Cardiothoracic surgery  Procedures  Cardiac Catheterization   Antibiotics   Anti-infectives (From admission, onward)   Start     Dose/Rate Route Frequency Ordered Stop   02/14/19 1550  vancomycin (VANCOCIN) 1,000 mg in sodium chloride 0.9 % 1,000 mL irrigation       As  needed 02/14/19 1551     02/14/19 1330  vancomycin (VANCOCIN) 1,000 mg in sodium chloride 0.9 % 1,000 mL irrigation      Irrigation To Surgery 02/14/19 1305 02/15/19 1330   02/14/19 1300  vancomycin (VANCOCIN) 1,250 mg in sodium chloride 0.9 % 250 mL IVPB     1,250 mg 166.7 mL/hr over 90 Minutes Intravenous To Surgery 02/14/19 1257 02/14/19 1534   02/14/19 1300  cefUROXime (ZINACEF) 1.5 g in sodium chloride 0.9 % 100 mL IVPB     1.5 g 200 mL/hr over 30 Minutes Intravenous To Surgery 02/14/19 1257 02/14/19 1420   02/14/19 1300  cefUROXime (ZINACEF) 750 mg in sodium chloride 0.9 % 100 mL IVPB     750 mg 200 mL/hr over 30 Minutes Intravenous To Surgery 02/14/19 1257 02/15/19 1300      Subjective:   Roger [name] seen and examined today.  He is to complain of chest pain.  Had an episode at approximately 4 AM this morning lasted a few minutes, stating it is intermittent.  States he is also had sweating and nausea with the chest pain.  Denies current shortness of breath, abdominal pain, vomiting, diarrhea or constipation.  Objective:   Vitals:   02/14/19 1310 02/14/19 1315 02/14/19 1320 02/14/19 1325  BP: 135/79 (!) 142/92 139/82 136/89  Pulse: 80 72 72 71  Resp: 17 13 11 12   Temp:      TempSrc:      SpO2: 98% 99% 99% 99%  Weight:      Height:        Intake/Output Summary (Last 24 hours) at 02/14/2019 1557 Last data filed at 02/14/2019 1515 Gross per 24 hour  Intake 2620.06 ml  Output 1500 ml  Net 1120.06 ml   Filed Weights   02/12/19 2100 02/13/19 0535 02/14/19 0528  Weight: 75.5 kg 75.2 kg 76.6 kg    Exam  General: Well developed, well nourished, NAD, appears stated age  81: NCAT, mucous membranes moist.   Neck: Supple  Cardiovascular: S1 S2 auscultated, RRR, no murmur  Respiratory: Clear to auscultation bilaterally with equal chest rise  Abdomen: Soft, nontender, nondistended, + bowel sounds  Extremities: warm dry without cyanosis clubbing or  edema  Neuro: AAOx3, nonfocal  Psych: Normal affect and demeanor with intact judgement and insight   Data Reviewed: I have personally reviewed following labs and imaging studies  CBC: Recent Labs  Lab 02/12/19 1259 02/12/19 1328 02/13/19 0233 02/14/19 0347  WBC 7.9  --  8.4 9.1  HGB 14.2 13.9 12.6* 12.6*  HCT 40.1 41.0 37.3* 36.8*  MCV 102.6*  --  102.8* 103.1*  PLT 258  --  249 [id number]   Basic Metabolic Panel: Recent Labs  Lab 02/12/19 1259 02/12/19 1328 02/13/19 0233 02/14/19 0347  NA 142 142 137 138  K 4.1 4.1 3.4* 4.6  CL 108  --  100 105  CO2 21*  --  25 25  GLUCOSE 104*  --  132* 116*  BUN 14  --  18 14  CREATININE 1.25*  --  1.29* 1.15  CALCIUM 9.4  --  9.3 9.3   GFR: Estimated Creatinine Clearance: 61.7 mL/min (by C-G formula based on SCr of 1.15 mg/dL). Liver Function Tests: No results for input(s): AST, ALT, ALKPHOS, BILITOT, PROT, ALBUMIN in the last 168 hours. No results for input(s): LIPASE, AMYLASE in the last 168 hours. No results for input(s): AMMONIA in the last 168 hours. Coagulation Profile: No results for input(s): INR, PROTIME in the last 168 hours. Cardiac Enzymes: No results for input(s): CKTOTAL, CKMB, CKMBINDEX, TROPONINI in the last 168 hours. BNP (last 3 results) No results for input(s): PROBNP in the last 8760 hours. HbA1C: Recent Labs    02/12/19 2116  HGBA1C 5.2   CBG: No results for input(s): GLUCAP in the last 168 hours. Lipid Profile: No results for input(s): CHOL, HDL, LDLCALC, TRIG, CHOLHDL, LDLDIRECT in the last 72 hours. Thyroid Function Tests: No results for input(s): TSH, T4TOTAL, FREET4, T3FREE, THYROIDAB in the last 72 hours. Anemia Panel: No results for input(s): VITAMINB12, FOLATE, FERRITIN, TIBC, IRON, RETICCTPCT in the last 72 hours. Urine analysis: No results found for: COLORURINE, APPEARANCEUR, LABSPEC, PHURINE, GLUCOSEU, HGBUR, BILIRUBINUR, KETONESUR, PROTEINUR, UROBILINOGEN, NITRITE, LEUKOCYTESUR Sepsis  Labs: @LABRCNTIP (procalcitonin:4,lacticidven:4)  ) Recent Results (from the past 240 hour(s))  SARS CORONAVIRUS 2 (TAT 6-24 HRS) Nasopharyngeal Nasopharyngeal Swab     Status: None   Collection Time: 02/12/19  1:46 PM   Specimen: Nasopharyngeal Swab  Result Value Ref Range Status   SARS Coronavirus 2 NEGATIVE NEGATIVE Final    Comment: (NOTE) SARS-CoV-2 target nucleic acids are NOT DETECTED. The SARS-CoV-2 RNA is generally detectable in upper and lower respiratory specimens during the acute phase of infection. Negative results do not preclude SARS-CoV-2 infection, do not rule out co-infections with other pathogens, and should not be used as the sole basis for treatment or other patient management decisions. Negative results must be combined with clinical observations, patient history, and epidemiological information. The expected result is Negative. Fact Sheet for Patients: SugarRoll.be Fact Sheet for Healthcare Providers: https://www.woods-mathews.com/ This test is not yet approved or cleared by the [place] FDA and  has been authorized for detection and/or  diagnosis of SARS-CoV-2 by FDA under an Emergency Use Authorization (EUA). This EUA will remain  in effect (meaning this test can be used) for the duration of the COVID-19 declaration under Section 56 4(b)(1) of the Act, 21 U.S.C. section 360bbb-3(b)(1), unless the authorization is terminated or revoked sooner. Performed at [facility], [place] [address], [place], [place] [address]   Surgical PCR screen     Status: None   Collection Time: 02/14/19 11:45 AM   Specimen: Nasal Mucosa; Nasal Swab  Result Value Ref Range Status   MRSA, PCR NEGATIVE NEGATIVE Final   Staphylococcus aureus NEGATIVE NEGATIVE Final    Comment: (NOTE) The Xpert SA Assay (FDA approved for NASAL specimens in patients 59 years of age and older), is one component of a comprehensive surveillance  program. It is not intended to diagnose infection nor to guide or monitor treatment. Performed at [facility], [place] [address], [place], [place] [address]       Radiology Studies: Vas US Doppler Pre Cabg  Result Date: 02/14/2019 PREOPERATIVE VASCULAR EVALUATION  Risk Factors:     Coronary artery disease. Comparison Study: No prior. Performing Technologist: Oda Cogan RDMS, RVT  Examination Guidelines: A complete evaluation includes B-mode imaging, spectral Doppler, color Doppler, and power Doppler as needed of all accessible portions of each vessel. Bilateral testing is considered an integral part of a complete examination. Limited examinations for reoccurring indications may be performed as noted.  Right Carotid Findings: +----------+--------+--------+--------+--------+------------------+             PSV cm/s EDV cm/s Stenosis Describe Comments            +----------+--------+--------+--------+--------+------------------+  CCA Prox   115      16                                             +----------+--------+--------+--------+--------+------------------+  CCA Distal 47       13                         intimal thickening  +----------+--------+--------+--------+--------+------------------+  ICA Prox   42       16       1-39%             intimal thickening  +----------+--------+--------+--------+--------+------------------+  ICA Distal 65       22                                             +----------+--------+--------+--------+--------+------------------+  ECA        88       14                                             +----------+--------+--------+--------+--------+------------------+ Portions of this table do not appear on this page. +----------+--------+-------+----------------+------------+             PSV cm/s EDV cms Describe         Arm Pressure  +----------+--------+-------+----------------+------------+  Subclavian 88               Multiphasic, WNL                +----------+--------+-------+----------------+------------+ +---------+--------+--+--------+--+---------+  Vertebral PSV cm/s 43 EDV cm/s 18 Antegrade  +---------+--------+--+--------+--+---------+ Left Carotid Findings: +----------+--------+--------+--------+--------+------------------+             PSV cm/s EDV cm/s Stenosis Describe Comments            +----------+--------+--------+--------+--------+------------------+  CCA Prox   63       15                                             +----------+--------+--------+--------+--------+------------------+  CCA Distal 56       16                         intimal thickening  +----------+--------+--------+--------+--------+------------------+  ICA Prox   57       21       1-39%    calcific                     +----------+--------+--------+--------+--------+------------------+  ICA Distal 55       24                                             +----------+--------+--------+--------+--------+------------------+  ECA        74       12                                             +----------+--------+--------+--------+--------+------------------+ +----------+--------+--------+----------------+------------+  Subclavian PSV cm/s EDV cm/s Describe         Arm Pressure  +----------+--------+--------+----------------+------------+             86                Multiphasic, WNL               +----------+--------+--------+----------------+------------+ +---------+--------+--+--------+--+---------+  Vertebral PSV cm/s 38 EDV cm/s 16 Antegrade  +---------+--------+--+--------+--+---------+  Summary: Right Carotid: Velocities in the right ICA are consistent with a 1-39% stenosis. Left Carotid: Velocities in the left ICA are consistent with a 1-39% stenosis. Vertebrals: Bilateral vertebral arteries demonstrate antegrade flow.  Electronically signed by Curt Jews MD on 02/14/2019 at 3:06:08 PM.    Final      Scheduled Meds:  [MAR Hold] aspirin  81 mg Oral Daily   [MAR Hold]  docusate sodium  100 mg Oral BID   epinephrine  0-10 mcg/min Intravenous To OR   magnesium sulfate  40 mEq Other To OR   [MAR Hold] metoprolol tartrate  25 mg Oral BID   [MAR Hold] mupirocin ointment  1 application Nasal BID   potassium chloride  80 mEq Other To OR   [MAR Hold] rosuvastatin  40 mg Oral q1800   [MAR Hold] sodium chloride flush  3 mL Intravenous Once   [MAR Hold] sodium chloride flush  3 mL Intravenous Q12H   [MAR Hold] sodium chloride flush  3 mL Intravenous Q12H   [MAR Hold] sodium chloride flush  3 mL Intravenous Q12H   tranexamic acid  2 mg/kg Intracatheter To OR   vancomycin 1000 mg in NS (1000 ml) irrigation for Dr. Roxy Manns case   Irrigation To OR  Continuous Infusions:  [MAR Hold] sodium chloride     [MAR Hold] sodium chloride     cefUROXime (ZINACEF)  IV     DOPamine     heparin 30,000 units/NS 1000 mL solution for CELLSAVER     heparin 1,000 Units/hr (02/14/19 0700)   milrinone     [MAR Hold] nitroGLYCERIN 30 mcg/min (02/14/19 1103)   [MAR Hold] sodium chloride       LOS: 2 days   Time Spent in minutes   30 minutes  Emberleigh Reily D.O. on 02/14/2019 at 3:57 PM  Between 7am to 7pm - Please see pager noted on [email or web address]  After 7pm go to [email or web address]  And look for the night coverage person covering for me after hours  [facility] Office  [phone]

## 2019-02-14 NOTE — Consult Note (Signed)
[address]       [place] [address]             [phone]        Deavin C Budzynski [place] Medical Record [id number] Date of Birth: 12-02-1945  Referring: No ref. provider found Primary Care: Rosine Door, MD Primary Cardiologist:Thomas Claiborne Billings, MD  Chief Complaint:    Chief Complaint  Patient presents with   Weakness   Chest Pain    History of Present Illness:      73 yo man underwent LHC yesterday to assess recent onset exertional chest pain; this demonstrated complex LAD stenoses. Decision was to refer for CABG, but he developed worsening CP early this am despite heparin and NTG gtts. Consult received for consideration of urgent CABG. Remains hemodynamically stable.   Current Activity/ Functional Status: Patient is independent with mobility/ambulation, transfers, ADL's, IADL's.   Zubrod Score: At the time of surgery this patients most appropriate activity status/level should be described as: []     0    Normal activity, no symptoms []     1    Restricted in physical strenuous activity but ambulatory, able to do out light work []     2    Ambulatory and capable of self care, unable to do work activities, up and about                 more than 50%  Of the time                            []     3    Only limited self care, in bed greater than 50% of waking hours []     4    Completely disabled, no self care, confined to bed or chair []     5    Moribund  Past Medical History:  Diagnosis Date   Chronic back pain    HLD (hyperlipidemia)    HTN (hypertension)     Past Surgical History:  Procedure Laterality Date   BACK SURGERY     CARDIAC CATHETERIZATION     LEFT HEART CATH AND CORONARY ANGIOGRAPHY N/A 02/13/2019   Procedure: LEFT HEART CATH AND CORONARY ANGIOGRAPHY;  Surgeon: Leonie Man, MD;  Location: [facility];  Service: Cardiovascular;  Laterality: N/A;    Social History   Tobacco Use  Smoking Status Never  Smoker  Smokeless Tobacco Never Used    Social History   Substance and Sexual Activity  Alcohol Use No     Allergies  Allergen Reactions   Hydrocodone-Acetaminophen Itching   Temazepam Itching   Trazodone And Nefazodone Itching    Current Facility-Administered Medications  Medication Dose Route Frequency Provider Last Rate Last Dose   [MAR Hold] 0.9 %  sodium chloride infusion  250 mL Intravenous PRN Leonie Man, MD       [facility] Hold] 0.9 %  sodium chloride infusion  250 mL Intravenous PRN Leonie Man, MD       [facility] Hold] aspirin chewable tablet 81 mg  81 mg Oral Daily Leonie Man, MD   81 mg at 02/14/19 1042   cefUROXime (ZINACEF) 1.5 g in sodium chloride 0.9 % 100 mL IVPB  1.5 g Intravenous To OR Mikhail, Velta Addison, DO       cefUROXime (ZINACEF) 750 mg in sodium chloride 0.9 % 100 mL IVPB  750 mg Intravenous To OR  Mikhail, Velta Addison, DO       dexmedetomidine (PRECEDEX) 400 MCG/100ML (4 mcg/mL) infusion  0.1-0.7 mcg/kg/hr Intravenous To OR Cristal Ford, DO       [MAR Hold] docusate sodium (COLACE) capsule 100 mg  100 mg Oral BID Leonie Man, MD   100 mg at 02/14/19 1043   DOPamine (INTROPIN) 800 mg in dextrose 5 % 250 mL (3.2 mg/mL) infusion  0-10 mcg/kg/min Intravenous To OR Mikhail, Velta Addison, DO       EPINEPHrine (ADRENALIN) 4 mg in NS 250 mL (0.016 mg/mL) premix infusion  0-10 mcg/min Intravenous To OR Mikhail, Maryann, DO       heparin 2,500 Units, papaverine 30 mg in electrolyte-148 (PLASMALYTE-148) 500 mL irrigation   Irrigation To OR Mikhail, Clinical biochemist, DO       heparin 30,000 units/NS 1000 mL solution for CELLSAVER   Other To OR Cristal Ford, DO       heparin ADULT infusion 100 units/mL (25000 units/222mL sodium chloride 0.45%)  1,000 Units/hr Intravenous Continuous Dessa Phi, DO 10 mL/hr at 02/14/19 0443 1,000 Units/hr at 02/14/19 0443   influenza vaccine adjuvanted (FLUAD) injection 0.5 mL  0.5 mL Intramuscular Prior to discharge  Merton Border, MD       insulin regular, human (MYXREDLIN) 100 units/ 100 mL infusion   Intravenous To OR Mikhail, Clinical biochemist, DO       magnesium sulfate (IV Push/IM) injection 40 mEq  40 mEq Other To OR Cristal Ford, DO       [MAR Hold] methocarbamol (ROBAXIN) tablet 500 mg  500 mg Oral Q6H PRN Leonie Man, MD       [facility] Hold] metoprolol tartrate (LOPRESSOR) tablet 25 mg  25 mg Oral BID Isaiah Serge, NP   25 mg at 02/14/19 1043   milrinone (PRIMACOR) 20 MG/100 ML (0.2 mg/mL) infusion  0.3 mcg/kg/min Intravenous To OR Cristal Ford, DO       [facility] Hold] morphine 2 MG/ML injection 2 mg  2 mg Intravenous Q3H PRN Leonie Man, MD   2 mg at 02/14/19 1015   [MAR Hold] mupirocin ointment (BACTROBAN) 2 % 1 application  1 application Nasal BID Rexene Alberts, MD       [facility] Hold] nitroGLYCERIN 50 mg in dextrose 5 % 250 mL (0.2 mg/mL) infusion  0-200 mcg/min Intravenous Titrated Leonie Man, MD 9 mL/hr at 02/14/19 1103 30 mcg/min at 02/14/19 1103   nitroGLYCERIN 50 mg in dextrose 5 % 250 mL (0.2 mg/mL) infusion  2-200 mcg/min Intravenous To OR Cristal Ford, DO       [MAR Hold] ondansetron [facility]) tablet 4 mg  4 mg Oral Q6H PRN Leonie Man, MD       [facility] Hold] ondansetron [facility]) injection 4 mg  4 mg Intravenous Q6H PRN Leonie Man, MD       [facility] Hold] oxyCODONE (Oxy IR/ROXICODONE) immediate release tablet 5-10 mg  5-10 mg Oral Q4H PRN Leonie Man, MD   5 mg at 02/14/19 [id number]   phenylephrine (NEOSYNEPHRINE) 20-0.9 MG/250ML-% infusion  30-200 mcg/min Intravenous To OR Mikhail, Velta Addison, DO       potassium chloride injection 80 mEq  80 mEq Other To OR Cristal Ford, DO       [facility] Hold] rosuvastatin (CRESTOR) tablet 40 mg  40 mg Oral q1800 Leonie Man, MD   40 mg at 02/13/19 1706   [MAR Hold] sodium chloride 0.9 % bolus 250 mL  250 mL Intravenous  Once Leonie Man, MD       [facility] Hold] sodium chloride flush (NS) 0.9 % injection 3 mL  3 mL  Intravenous Once Leonie Man, MD       [facility] Hold] sodium chloride flush (NS) 0.9 % injection 3 mL  3 mL Intravenous Q12H Leonie Man, MD   3 mL at 02/12/19 2200   [MAR Hold] sodium chloride flush (NS) 0.9 % injection 3 mL  3 mL Intravenous PRN Leonie Man, MD       [facility] Hold] sodium chloride flush (NS) 0.9 % injection 3 mL  3 mL Intravenous Q12H Leonie Man, MD   3 mL at 02/13/19 0902   [MAR Hold] sodium chloride flush (NS) 0.9 % injection 3 mL  3 mL Intravenous Q12H Leonie Man, MD   3 mL at 02/14/19 1026   [MAR Hold] sodium chloride flush (NS) 0.9 % injection 3 mL  3 mL Intravenous PRN Leonie Man, MD       tranexamic acid (CYKLOKAPRON) 2,500 mg in sodium chloride 0.9 % 250 mL (10 mg/mL) infusion  1.5 mg/kg/hr Intravenous To OR Mikhail, Velta Addison, DO       tranexamic acid (CYKLOKAPRON) bolus via infusion - over 30 minutes 1,149 mg  15 mg/kg Intravenous To OR Mikhail, Clinical biochemist, DO       tranexamic acid (CYKLOKAPRON) pump prime solution 153 mg  2 mg/kg Intracatheter To OR Mikhail, Clinical biochemist, DO       vancomycin (VANCOCIN) 1,000 mg in sodium chloride 0.9 % 1,000 mL irrigation   Irrigation To OR Darshawn Boateng, Glenice Bow, MD       vancomycin (VANCOCIN) 1,250 mg in sodium chloride 0.9 % 250 mL IVPB  1,250 mg Intravenous To OR Cristal Ford, DO        Medications Prior to Admission  Medication Sig Dispense Refill Last Dose   meloxicam (MOBIC) 15 MG tablet Take 15 mg by mouth daily.   Past Week at Unknown time   methocarbamol (ROBAXIN) 500 MG tablet Take 1 tablet (500 mg total) by mouth every 6 (six) hours as needed (muscle spasm). 40 tablet 0 May   zolpidem (AMBIEN) 10 MG tablet Take 10 mg by mouth at bedtime.   Past Week at Unknown time   aspirin EC 325 MG tablet Take 1 tablet (325 mg total) by mouth daily. (Patient not taking: Reported on 02/12/2019) 42 tablet 0 Not Taking at Unknown time   docusate sodium (COLACE) 100 MG capsule Take 1 capsule (100 mg total) by  mouth 2 (two) times daily. While taking narcotic pain medicine. (Patient not taking: Reported on 02/12/2019) 30 capsule 0 Not Taking at Unknown time   oxyCODONE (OXY IR/ROXICODONE) 5 MG immediate release tablet Take 1-2 tablets (5-10 mg total) by mouth every 4 (four) hours as needed for pain (moderate to severe pain). (Patient not taking: Reported on 02/12/2019) 50 tablet 0 Not Taking at Unknown time   senna (SENOKOT) 8.6 MG tablet Take 2 tablets (17.2 mg total) by mouth daily. While taking narcotic pain medicine. (Patient not taking: Reported on 02/12/2019) 60 tablet 0 Not Taking at Unknown time    Family History  Problem Relation Age of Onset   Heart disease Father    Heart disease Brother      Review of Systems:   ROS Review of systems not obtained due to patient factors.     Cardiac Review of Systems: Y or  [    ]= no  Chest Pain [    ]  Resting SOB [   ] Exertional SOB  [  ]  Orthopnea [  ]   Pedal Edema [   ]    Palpitations [  ] Syncope  [  ]   Presyncope [   ]  General Review of Systems: [Y] = yes [  ]=no Constitional: recent weight change [  ]; anorexia [  ]; fatigue [  ]; nausea [  ]; night sweats [  ]; fever [  ]; or chills [  ]                                                               Dental: Last Dentist visit:   Eye : blurred vision [  ]; diplopia [   ]; vision changes [  ];  Amaurosis fugax[  ]; Resp: cough [  ];  wheezing[  ];  hemoptysis[  ]; shortness of breath[  ]; paroxysmal nocturnal dyspnea[  ]; dyspnea on exertion[  ]; or orthopnea[  ];  GI:  gallstones[  ], vomiting[  ];  dysphagia[  ]; melena[  ];  hematochezia [  ]; heartburn[  ];   Hx of  Colonoscopy[  ]; GU: kidney stones [  ]; hematuria[  ];   dysuria [  ];  nocturia[  ];  history of     obstruction [  ]; urinary frequency [  ]             Skin: rash, swelling[  ];, hair loss[  ];  peripheral edema[  ];  or itching[  ]; Musculosketetal: myalgias[  ];  joint swelling[  ];  joint erythema[  ];  joint  pain[  ];  back pain[  ];  Heme/Lymph: bruising[  ];  bleeding[  ];  anemia[  ];  Neuro: TIA[  ];  headaches[  ];  stroke[  ];  vertigo[  ];  seizures[  ];   paresthesias[  ];  difficulty walking[  ];  Psych:depression[  ]; anxiety[  ];  Endocrine: diabetes[  ];  thyroid dysfunction[  ];          Physical Exam: BP 136/89    Pulse 71    Temp (!) 97.5 F (36.4 C) (Oral)    Resp 12    Ht 5\' 10"  (1.778 m)    Wt 76.6 kg    SpO2 99%    BMI 24.23 kg/m    General appearance: alert and cooperative Head: Normocephalic, without obvious abnormality, atraumatic Cardio: regular rate and rhythm, S1, S2 normal, no murmur, click, rub or gallop Extremities: extremities normal, atraumatic, no cyanosis or edema Neurologic: Alert and oriented X 3, normal strength and tone. Normal symmetric reflexes. Normal coordination and gait  Diagnostic Studies & Laboratory data:     Recent Radiology Findings:   Ct Angio Chest Pe W And/or Wo Contrast  Result Date: 02/12/2019 CLINICAL DATA:  73 year old male with 2 weeks of intermittent substernal chest pain and subjective fever. EXAM: CT ANGIOGRAPHY CHEST WITH CONTRAST TECHNIQUE: Multidetector CT imaging of the chest was performed using the standard protocol during bolus administration of intravenous contrast. Multiplanar CT image reconstructions and MIPs were obtained to evaluate the vascular anatomy. CONTRAST:  153mL OMNIPAQUE IOHEXOL 350  MG/ML SOLN COMPARISON:  None. FINDINGS: Cardiovascular: Adequate opacification of the pulmonary arteries to the segmental level. No evidence of central filling defect to suggest pulmonary embolism. The heart is normal in size. No pericardial effusion. Calcifications visualized along the coronary arteries. Tortuous thoracic aorta with trace atherosclerotic calcifications. No evidence of aneurysm. Mediastinum/Nodes: Unremarkable CT appearance of the thyroid gland. No suspicious mediastinal or hilar adenopathy. No soft tissue mediastinal  mass. Moderately large hiatal hernia. Lungs/Pleura: Minimal dependent atelectasis. No suspicious pulmonary mass or nodule. No evidence of emphysema or fibrosis. No pleural effusion or pneumothorax. Upper Abdomen: Visualized upper abdominal organs are unremarkable. Musculoskeletal: No acute fracture or aggressive appearing lytic or blastic osseous lesion. Review of the MIP images confirms the above findings. IMPRESSION: 1. Negative for pulmonary embolus, pneumonia or other acute cardiopulmonary process. 2. Moderately large hiatal hernia. This could represent a source for intermittent substernal chest pain. 3. Mild aortic atherosclerotic calcifications. Aortic Atherosclerosis (ICD10-170.0) 4. Coronary artery calcifications are also present. Electronically Signed   By: Jacqulynn Cadet M.D.   On: 02/12/2019 15:25   Dg Chest Port 1 View  Result Date: 02/12/2019 CLINICAL DATA:  Shortness of breath and chest pain over the last day. EXAM: PORTABLE CHEST 1 VIEW COMPARISON:  None. FINDINGS: Cardiomegaly. Aortic atherosclerosis and tortuosity. Pulmonary venous hypertension, possibly with early interstitial edema. Question small effusion on the left. No acute bone finding. IMPRESSION: Probable acute congestive heart failure with cardiomegaly, pulmonary venous hypertension and early interstitial edema. Electronically Signed   By: Nelson Chimes M.D.   On: 02/12/2019 14:02   Vas US Doppler Pre Cabg  Result Date: 02/14/2019 PREOPERATIVE VASCULAR EVALUATION  Risk Factors:     Coronary artery disease. Comparison Study: No prior. Performing Technologist: Oda Cogan RDMS, RVT  Examination Guidelines: A complete evaluation includes B-mode imaging, spectral Doppler, color Doppler, and power Doppler as needed of all accessible portions of each vessel. Bilateral testing is considered an integral part of a complete examination. Limited examinations for reoccurring indications may be performed as noted.  Right Carotid  Findings: +----------+--------+--------+--------+--------+------------------+             PSV cm/s EDV cm/s Stenosis Describe Comments            +----------+--------+--------+--------+--------+------------------+  CCA Prox   115      16                                             +----------+--------+--------+--------+--------+------------------+  CCA Distal 47       13                         intimal thickening  +----------+--------+--------+--------+--------+------------------+  ICA Prox   42       16       1-39%             intimal thickening  +----------+--------+--------+--------+--------+------------------+  ICA Distal 65       22                                             +----------+--------+--------+--------+--------+------------------+  ECA        88       14                                             +----------+--------+--------+--------+--------+------------------+  Portions of this table do not appear on this page. +----------+--------+-------+----------------+------------+             PSV cm/s EDV cms Describe         Arm Pressure  +----------+--------+-------+----------------+------------+  Subclavian 88               Multiphasic, WNL               +----------+--------+-------+----------------+------------+ +---------+--------+--+--------+--+---------+  Vertebral PSV cm/s 43 EDV cm/s 18 Antegrade  +---------+--------+--+--------+--+---------+ Left Carotid Findings: +----------+--------+--------+--------+--------+------------------+             PSV cm/s EDV cm/s Stenosis Describe Comments            +----------+--------+--------+--------+--------+------------------+  CCA Prox   63       15                                             +----------+--------+--------+--------+--------+------------------+  CCA Distal 56       16                         intimal thickening  +----------+--------+--------+--------+--------+------------------+  ICA Prox   57       21       1-39%    calcific                      +----------+--------+--------+--------+--------+------------------+  ICA Distal 55       24                                             +----------+--------+--------+--------+--------+------------------+  ECA        74       12                                             +----------+--------+--------+--------+--------+------------------+ +----------+--------+--------+----------------+------------+  Subclavian PSV cm/s EDV cm/s Describe         Arm Pressure  +----------+--------+--------+----------------+------------+             86                Multiphasic, WNL               +----------+--------+--------+----------------+------------+ +---------+--------+--+--------+--+---------+  Vertebral PSV cm/s 38 EDV cm/s 16 Antegrade  +---------+--------+--+--------+--+---------+  Summary: Right Carotid: Velocities in the right ICA are consistent with a 1-39% stenosis. Left Carotid: Velocities in the left ICA are consistent with a 1-39% stenosis. Vertebrals: Bilateral vertebral arteries demonstrate antegrade flow.    Preliminary      I have independently reviewed the above radiologic studies and discussed with the patient   Recent Lab Findings: Lab Results  Component Value Date   WBC 9.1 02/14/2019   HGB 12.6 (L) 02/14/2019   HCT 36.8 (L) 02/14/2019   PLT 211 02/14/2019   GLUCOSE 116 (H) 02/14/2019   NA 138 02/14/2019   K 4.6 02/14/2019   CL 105 02/14/2019   CREATININE 1.15 02/14/2019   BUN 14 02/14/2019   CO2 25 02/14/2019   HGBA1C 5.2 02/12/2019      Assessment / Plan:  73 yo man with no significant comorbidities and worsening angina. Agree with urgent CABG.      I  spent 30 minutes counseling the patient face to face.   Jaquana Geiger Z. Orvan Seen, MD [phone] 02/14/2019 1:29 PM

## 2019-02-14 NOTE — Anesthesia Procedure Notes (Signed)
Procedure Name: Intubation Date/Time: 02/14/2019 1:58 PM Performed by: Barrington Ellison, CRNA Pre-anesthesia Checklist: Patient identified, Emergency Drugs available, Suction available and Patient being monitored Patient Re-evaluated:Patient Re-evaluated prior to induction Oxygen Delivery Method: Circle System Utilized Preoxygenation: Pre-oxygenation with 100% oxygen Induction Type: IV induction Laryngoscope Size: Mac and 4 Grade View: Grade I Tube type: Oral Number of attempts: 1 Airway Equipment and Method: Stylet Placement Confirmation: ETT inserted through vocal cords under direct vision,  positive ETCO2 and breath sounds checked- equal and bilateral Secured at: 22 cm Tube secured with: Tape Dental Injury: Teeth and Oropharynx as per pre-operative assessment

## 2019-02-14 NOTE — Transfer of Care (Signed)
Immediate Anesthesia Transfer of Care Note  Patient: Roger [name]  Procedure(s) Performed: CORONARY ARTERY BYPASS GRAFTING (CABG) TIMES THREE USING LEFT INTERNAL MAMMARY ARTERY AND ENDOSCOPIC HARVESTING OF RIGHT GREATER SAPHENOUS VEIN (N/A Chest) TRANSESOPHAGEAL ECHOCARDIOGRAM (TEE) (N/A )  Patient Location: ICU  Anesthesia Type:General  Level of Consciousness: Patient remains intubated per anesthesia plan  Airway & Oxygen Therapy: Patient remains intubated per anesthesia plan and Patient placed on Ventilator (see vital sign flow sheet for setting)  Post-op Assessment: Report given to RN  Post vital signs: Reviewed and stable  Last Vitals:  Vitals Value Taken Time  BP 107/70 02/14/19 1829  Temp 36.4 C 02/14/19 1835  Pulse 87 02/14/19 1835  Resp 13 02/14/19 1835  SpO2 100 % 02/14/19 1835  Vitals shown include unvalidated device data.  Last Pain:  Vitals:   02/14/19 1250  TempSrc:   PainSc: 4       Patients Stated Pain Goal: 0 ([id number])  Complications: No apparent anesthesia complications

## 2019-02-14 NOTE — Progress Notes (Signed)
Pt made good effort on NIF and VC but unable to meet extubation criteria. Pt placed back on full support at this time. RT will reassess pt at a later time to start heart wean process again. Pt on VT 580, RR 12, PEEP 5 and FIO2 50%. RT will continue to monitor.

## 2019-02-14 NOTE — Progress Notes (Signed)
Precabg carotid study done only, patient was on his way to surgery and did not have time to complete his test. Refer to [facility] under chart review to view preliminary results.   02/14/2019  12:03 PM Veralyn Lopp, Bonnye Fava

## 2019-02-14 NOTE — Progress Notes (Signed)
RT assessed pt for next phase of heart wean. Pt was able to hold head off of pillow for 20+ sec, stick out tongue, wiggle toes, and squeeze hand upon RT request. Pt was placed in next phase CPAP/PSV 10/5 with FIO2 40%. Pt respiratory status stable at this time. RT will reassess pt in 20 minutes after RN has obtained ABG to assess pts ability to be liberated from the vent. RT will continue to monitor.

## 2019-02-14 NOTE — Brief Op Note (Addendum)
02/12/2019 - 02/14/2019  4:43 PM  PATIENT:  Roger [name]  73 y.o. male  PRE-OPERATIVE DIAGNOSIS:  Coronary Artery Disease  POST-OPERATIVE DIAGNOSIS:  Coronary Artery Disease PROCEDURE:   CORONARY ARTERY BYPASS GRAFTING  X 3  USING LEFT INTERNAL MAMMARY ARTERY AND ENDOSCOPIC HARVESTING OF RIGHT GREATER SAPHENOUS VEIN   LIMA-> LAD  SVG-> D1  SVG-> RI  TRANSESOPHAGEAL ECHOCARDIOGRAM   SURGEON: [name], MD   PHYSICIAN ASSISTANT: Roddenberry   ANESTHESIA:   general  EBL:  Per anesthesia and perfusion records   BLOOD ADMINISTERED:none  DRAINS: Left pleural and mediastinal drains   LOCAL MEDICATIONS USED:  NONE  SPECIMEN:  No Specimen  DISPOSITION OF SPECIMEN:  N/A  COUNTS:  YES  DICTATION: .Dragon Dictation  PLAN OF CARE: Admit to inpatient   PATIENT DISPOSITION:  ICU - intubated and hemodynamically stable.   Delay start of Pharmacological VTE agent (>24hrs) due to surgical blood loss or risk of bleeding: yes  Roger Bare Z. Roger [name], [place]

## 2019-02-14 NOTE — Progress Notes (Signed)
RT assessed pts readiness to wean per heart wean protocol. Pt is able to lift head off of pillow for 20+ sec, stick out tongue, squeeze hand, and wiggle toes upon RT request. First phase initiated w/pt placed on VT 580, RR 4, PEEP 5, and FIO2 40%. Pt respiratory status stable at this time. RT will reassess for next phase of wean in 20 minutes. RT will continue to monitor.

## 2019-02-14 NOTE — Anesthesia Procedure Notes (Signed)
Central Venous Catheter Insertion Performed by: Roderic Palau, MD, anesthesiologist Start/End10/14/2020 12:55 PM, 02/14/2019 1:10 PM Patient location: Pre-op. Preanesthetic checklist: patient identified, IV checked, site marked, risks and benefits discussed, surgical consent, monitors and equipment checked, pre-op evaluation, timeout performed and anesthesia consent Hand hygiene performed  and maximum sterile barriers used  PA cath was placed.Swan type:thermodilution PA Cath depth:50 Procedure performed without using ultrasound guided technique. Attempts: 1 Patient tolerated the procedure well with no immediate complications.

## 2019-02-14 NOTE — Progress Notes (Signed)
RT assessed pt for readiness to attempt heart wean for second time. Pt able to follow all commands for RT. Pt placed in first phase of wean on VT 580, RR 4, FIO2 40%, PEEP 5. Pt respiratory status is stable at this time. RT will reassess pt in 20 minutes for next phase of heart wean in 20 min. RT will continue to monitor.

## 2019-02-14 NOTE — Anesthesia Preprocedure Evaluation (Addendum)
Anesthesia Evaluation  Patient identified by MRN, date of birth, ID band Patient awake    Reviewed: Allergy & Precautions, H&P , NPO status , Patient's Chart, lab work & pertinent test results  Airway Mallampati: II  TM Distance: >3 FB Neck ROM: Full    Dental no notable dental hx. (+) Edentulous Upper, Edentulous Lower, Dental Advisory Given   Pulmonary neg pulmonary ROS,    Pulmonary exam normal breath sounds clear to auscultation       Cardiovascular hypertension, + angina at rest + CAD   Rhythm:Regular Rate:Normal     Neuro/Psych negative neurological ROS  negative psych ROS   GI/Hepatic negative GI ROS, Neg liver ROS,   Endo/Other  negative endocrine ROS  Renal/GU negative Renal ROS  negative genitourinary   Musculoskeletal   Abdominal   Peds  Hematology negative hematology ROS (+)   Anesthesia Other Findings   Reproductive/Obstetrics negative OB ROS                            Anesthesia Physical Anesthesia Plan  ASA: IV and emergent  Anesthesia Plan: General   Post-op Pain Management:    Induction: Intravenous  PONV Risk Score and Plan: 2 and Midazolam and Ondansetron  Airway Management Planned: Oral ETT  Additional Equipment: Arterial line, CVP, PA Cath, TEE and Ultrasound Guidance Line Placement  Intra-op Plan:   Post-operative Plan: Post-operative intubation/ventilation  Informed Consent: I have reviewed the patients History and Physical, chart, labs and discussed the procedure including the risks, benefits and alternatives for the proposed anesthesia with the patient or authorized representative who has indicated his/her understanding and acceptance.     Dental advisory given  Plan Discussed with: CRNA  Anesthesia Plan Comments:         Anesthesia Quick Evaluation

## 2019-02-15 ENCOUNTER — Encounter (HOSPITAL_COMMUNITY): Payer: Self-pay | Admitting: Cardiothoracic Surgery

## 2019-02-15 ENCOUNTER — Inpatient Hospital Stay (HOSPITAL_COMMUNITY): Payer: Medicare Other

## 2019-02-15 DIAGNOSIS — Z951 Presence of aortocoronary bypass graft: Secondary | ICD-10-CM

## 2019-02-15 LAB — CBC
HCT: 30.8 % — ABNORMAL LOW (ref 39.0–52.0)
HCT: 29.2 % — ABNORMAL LOW (ref 39.0–52.0)
Hemoglobin: 10.4 g/dL — ABNORMAL LOW (ref 13.0–17.0)
Hemoglobin: 10.3 g/dL — ABNORMAL LOW (ref 13.0–17.0)
MCH: 35.4 pg — ABNORMAL HIGH (ref 26.0–34.0)
MCH: 36.3 pg — ABNORMAL HIGH (ref 26.0–34.0)
MCHC: 33.8 g/dL (ref 30.0–36.0)
MCHC: 35.3 g/dL (ref 30.0–36.0)
MCV: 104.8 fL — ABNORMAL HIGH (ref 80.0–100.0)
MCV: 102.8 fL — ABNORMAL HIGH (ref 80.0–100.0)
Platelets: 120 10*3/uL — ABNORMAL LOW (ref 150–400)
Platelets: 124 10*3/uL — ABNORMAL LOW (ref 150–400)
RBC: 2.94 MIL/uL — ABNORMAL LOW (ref 4.22–5.81)
RBC: 2.84 MIL/uL — ABNORMAL LOW (ref 4.22–5.81)
RDW: 12.8 % (ref 11.5–15.5)
RDW: 12.8 % (ref 11.5–15.5)
WBC: 8.7 10*3/uL (ref 4.0–10.5)
WBC: 10.3 10*3/uL (ref 4.0–10.5)
nRBC: 0 % (ref 0.0–0.2)
nRBC: 0 % (ref 0.0–0.2)

## 2019-02-15 LAB — POCT I-STAT 7, (LYTES, BLD GAS, ICA,H+H)
Acid-base deficit: 2 mmol/L (ref 0.0–2.0)
Acid-base deficit: 3 mmol/L — ABNORMAL HIGH (ref 0.0–2.0)
Bicarbonate: 22.2 mmol/L (ref 20.0–28.0)
Bicarbonate: 23.1 mmol/L (ref 20.0–28.0)
Calcium, Ion: 1.17 mmol/L (ref 1.15–1.40)
Calcium, Ion: 1.17 mmol/L (ref 1.15–1.40)
HCT: 29 % — ABNORMAL LOW (ref 39.0–52.0)
HCT: 31 % — ABNORMAL LOW (ref 39.0–52.0)
Hemoglobin: 10.5 g/dL — ABNORMAL LOW (ref 13.0–17.0)
Hemoglobin: 9.9 g/dL — ABNORMAL LOW (ref 13.0–17.0)
O2 Saturation: 98 %
O2 Saturation: 99 %
Patient temperature: 37.2
Patient temperature: 37.3
Potassium: 4.1 mmol/L (ref 3.5–5.1)
Potassium: 4.5 mmol/L (ref 3.5–5.1)
Sodium: 134 mmol/L — ABNORMAL LOW (ref 135–145)
Sodium: 134 mmol/L — ABNORMAL LOW (ref 135–145)
TCO2: 23 mmol/L (ref 22–32)
TCO2: 24 mmol/L (ref 22–32)
pCO2 arterial: 39.6 mmHg (ref 32.0–48.0)
pCO2 arterial: 40.1 mmHg (ref 32.0–48.0)
pH, Arterial: 7.354 (ref 7.350–7.450)
pH, Arterial: 7.375 (ref 7.350–7.450)
pO2, Arterial: 114 mmHg — ABNORMAL HIGH (ref 83.0–108.0)
pO2, Arterial: 123 mmHg — ABNORMAL HIGH (ref 83.0–108.0)

## 2019-02-15 LAB — GLUCOSE, CAPILLARY
Glucose-Capillary: 101 mg/dL — ABNORMAL HIGH (ref 70–99)
Glucose-Capillary: 103 mg/dL — ABNORMAL HIGH (ref 70–99)
Glucose-Capillary: 107 mg/dL — ABNORMAL HIGH (ref 70–99)
Glucose-Capillary: 107 mg/dL — ABNORMAL HIGH (ref 70–99)
Glucose-Capillary: 109 mg/dL — ABNORMAL HIGH (ref 70–99)
Glucose-Capillary: 116 mg/dL — ABNORMAL HIGH (ref 70–99)
Glucose-Capillary: 132 mg/dL — ABNORMAL HIGH (ref 70–99)
Glucose-Capillary: 141 mg/dL — ABNORMAL HIGH (ref 70–99)
Glucose-Capillary: 98 mg/dL (ref 70–99)

## 2019-02-15 LAB — BASIC METABOLIC PANEL
Anion gap: 8 (ref 5–15)
Anion gap: 9 (ref 5–15)
BUN: 11 mg/dL (ref 8–23)
BUN: 11 mg/dL (ref 8–23)
CO2: 22 mmol/L (ref 22–32)
CO2: 23 mmol/L (ref 22–32)
Calcium: 8 mg/dL — ABNORMAL LOW (ref 8.9–10.3)
Calcium: 8.3 mg/dL — ABNORMAL LOW (ref 8.9–10.3)
Chloride: 102 mmol/L (ref 98–111)
Chloride: 97 mmol/L — ABNORMAL LOW (ref 98–111)
Creatinine, Ser: 1.09 mg/dL (ref 0.61–1.24)
Creatinine, Ser: 1.17 mg/dL (ref 0.61–1.24)
GFR calc Af Amer: >60 mL/min (ref >60)
GFR calc Af Amer: >60 mL/min (ref >60)
GFR calc non Af Amer: >60 mL/min (ref >60)
GFR calc non Af Amer: >60 mL/min (ref >60)
Glucose, Bld: 104 mg/dL — ABNORMAL HIGH (ref 70–99)
Glucose, Bld: 123 mg/dL — ABNORMAL HIGH (ref 70–99)
Potassium: 4.4 mmol/L (ref 3.5–5.1)
Potassium: 4.4 mmol/L (ref 3.5–5.1)
Sodium: 132 mmol/L — ABNORMAL LOW (ref 135–145)
Sodium: 129 mmol/L — ABNORMAL LOW (ref 135–145)

## 2019-02-15 LAB — HEPARIN LEVEL (UNFRACTIONATED): Heparin Unfractionated: <0.1 IU/mL — ABNORMAL LOW (ref 0.30–0.70)

## 2019-02-15 LAB — MAGNESIUM
Magnesium: 2.5 mg/dL — ABNORMAL HIGH (ref 1.7–2.4)
Magnesium: 2.3 mg/dL (ref 1.7–2.4)

## 2019-02-15 MED ORDER — KETOROLAC TROMETHAMINE 15 MG/ML IJ SOLN
7.5000 mg | Freq: Four times a day (QID) | INTRAMUSCULAR | Status: AC
  Administered 2019-02-15 – 2019-02-16 (×5): 7.5 mg via INTRAVENOUS
  Filled 2019-02-15 (×5): qty 1

## 2019-02-15 MED ORDER — LISINOPRIL 5 MG PO TABS
5.0000 mg | ORAL_TABLET | Freq: Every day | ORAL | Status: DC
  Administered 2019-02-15: 5 mg via ORAL
  Filled 2019-02-15: qty 1

## 2019-02-15 MED ORDER — INSULIN ASPART 100 UNIT/ML ~~LOC~~ SOLN
0.0000 [IU] | SUBCUTANEOUS | Status: DC
  Administered 2019-02-15 – 2019-02-16 (×4): 2 [IU] via SUBCUTANEOUS

## 2019-02-15 MED ORDER — ORAL CARE MOUTH RINSE
15.0000 mL | Freq: Two times a day (BID) | OROMUCOSAL | Status: DC
  Administered 2019-02-15 – 2019-02-19 (×7): 15 mL via OROMUCOSAL

## 2019-02-15 MED ORDER — COLCHICINE 0.6 MG PO TABS
0.6000 mg | ORAL_TABLET | Freq: Every day | ORAL | Status: DC
  Administered 2019-02-15 – 2019-02-19 (×5): 0.6 mg via ORAL
  Filled 2019-02-15 (×5): qty 1

## 2019-02-15 NOTE — Progress Notes (Signed)
Patient ID: Roger [name], male   DOB: 07/28/1945, 73 y.o.   MRN: [id number] EVENING ROUNDS NOTE :     [address]       [place] [address]             [phone]                 1 Day Post-Op Procedure(s) (LRB): CORONARY ARTERY BYPASS GRAFTING (CABG) TIMES THREE USING LEFT INTERNAL MAMMARY ARTERY AND ENDOSCOPIC HARVESTING OF RIGHT GREATER SAPHENOUS VEIN (N/A) TRANSESOPHAGEAL ECHOCARDIOGRAM (TEE) (N/A)  Total Length of Stay:  LOS: 3 days  BP (!) 98/59   Pulse 94   Temp 97.7 F (36.5 C) (Oral)   Resp (!) 24   Ht 5\' 10"  (1.778 m)   Wt 78.7 kg   SpO2 94%   BMI 24.89 kg/m   .Intake/Output      10/14 0701 - 10/15 0700 10/15 0701 - 10/16 0700   P.O. 620 360   I.V. (mL/kg) 3298.2 (41.9) 53.3 (0.7)   Blood 271    IV Piggyback 879.8 105.3   Total Intake(mL/kg) 5068.9 (64.4) 518.6 (6.6)   Urine (mL/kg/hr) 2600 (1.4) 670 (0.7)   Emesis/NG output 0    Stool 0    Blood 500    Chest Tube 360 290   Total Output 3460 960   Net +1608.9 -441.4        Urine Occurrence 1 x    Stool Occurrence 1 x      . sodium chloride Stopped (02/14/19 2216)  . sodium chloride    . sodium chloride    . sodium chloride    . sodium chloride 20 mL/hr at 02/14/19 1842  . cefUROXime (ZINACEF)  IV Stopped (02/15/19 1000)  . lactated ringers    . lactated ringers    . lactated ringers Stopped (02/15/19 1220)  . niCARDipine Stopped (02/14/19 2151)  . nitroGLYCERIN Stopped (02/14/19 1933)  . phenylephrine (NEO-SYNEPHRINE) Adult infusion Stopped (02/14/19 1825)  . sodium chloride       Lab Results  Component Value Date   WBC 10.3 02/15/2019   HGB 10.3 (L) 02/15/2019   HCT 29.2 (L) 02/15/2019   PLT 124 (L) 02/15/2019   GLUCOSE 123 (H) 02/15/2019   NA 129 (L) 02/15/2019   K 4.4 02/15/2019   CL 97 (L) 02/15/2019   CREATININE 1.17 02/15/2019   BUN 11 02/15/2019   CO2 23 02/15/2019   INR 1.3 (H) 02/14/2019   HGBA1C 5.2 02/12/2019   dau one post op emergency cabg  yesterday Stable day   Grace Isaac MD  Beeper [phone] Office [phone] 02/15/2019 6:44 PM

## 2019-02-15 NOTE — Progress Notes (Signed)
RT assessed pts readiness for extubation. Pt NIF -25, VC 850 ml with good pt effort. Pt ABG good for extubation as well.

## 2019-02-15 NOTE — Progress Notes (Signed)
Patient underwent urgent CABG on 02/14/2019. Patient currently under the care of cardiothoracic surgery.    TRH will sign off. If needed please feel to reconsult [place].

## 2019-02-15 NOTE — Anesthesia Postprocedure Evaluation (Signed)
Anesthesia Post Note  Patient: Roger [name]  Procedure(s) Performed: CORONARY ARTERY BYPASS GRAFTING (CABG) TIMES THREE USING LEFT INTERNAL MAMMARY ARTERY AND ENDOSCOPIC HARVESTING OF RIGHT GREATER SAPHENOUS VEIN (N/A Chest) TRANSESOPHAGEAL ECHOCARDIOGRAM (TEE) (N/A )     Patient location during evaluation: ICU Anesthesia Type: General Level of consciousness: awake and alert Pain management: pain level controlled Vital Signs Assessment: post-procedure vital signs reviewed and stable Respiratory status: spontaneous breathing, nonlabored ventilation and respiratory function stable Cardiovascular status: blood pressure returned to baseline and stable Postop Assessment: no apparent nausea or vomiting Anesthetic complications: no    Last Vitals:  Vitals:   02/15/19 0800 02/15/19 0919  BP: 121/81 118/80  Pulse: 100   Resp: (!) 21   Temp: 37.6 C   SpO2: 100%     Last Pain:  Vitals:   02/15/19 0919  TempSrc:   PainSc: 6                  Deklynn Charlet,W. EDMOND

## 2019-02-15 NOTE — Progress Notes (Signed)
1 Day Post-Op Procedure(s) (LRB): CORONARY ARTERY BYPASS GRAFTING (CABG) TIMES THREE USING LEFT INTERNAL MAMMARY ARTERY AND ENDOSCOPIC HARVESTING OF RIGHT GREATER SAPHENOUS VEIN (N/A) TRANSESOPHAGEAL ECHOCARDIOGRAM (TEE) (N/A) Subjective: No complaints  Objective: Vital signs in last 24 hours: Temp:  [97.3 F (36.3 C)-99.7 F (37.6 C)] 99.7 F (37.6 C) (10/15 0700) Pulse Rate:  [71-104] 104 (10/15 0700) Cardiac Rhythm: Normal sinus rhythm;Sinus tachycardia (10/15 0400) Resp:  [11-31] 28 (10/15 0700) BP: (98-146)/(60-103) 123/75 (10/15 0700) SpO2:  [94 %-100 %] 100 % (10/15 0700) Arterial Line BP: (100-166)/(52-94) 126/63 (10/15 0700) FiO2 (%):  [40 %-50 %] 40 % (10/14 2334) Weight:  [78.7 kg] 78.7 kg (10/15 0500)  Hemodynamic parameters for last 24 hours: PAP: (18-34)/(9-18) 34/18 CO:  [3 L/min-4.9 L/min] 4.9 L/min CI:  [1.5 L/min/m2-2.5 L/min/m2] 2.5 L/min/m2  Intake/Output from previous day: 10/14 0701 - 10/15 0700 In: 5068.9 [P.O.:620; I.V.:3298.2; Blood:271; IV Piggyback:879.8] Out: [id number] [Urine:2600; Blood:500; Chest Tube:360] Intake/Output this shift: No intake/output data recorded.  General appearance: alert, cooperative and no distress Neurologic: intact Heart: regular rate and rhythm, S1, S2 normal, no murmur, click, rub or gallop Lungs: clear to auscultation bilaterally Extremities: extremities normal, atraumatic, no cyanosis or edema Wound: dressed, dry  Lab Results: Recent Labs    02/14/19 1841  02/15/19 0207 02/15/19 0403  WBC 12.2*  --   --  8.7  HGB 10.3*   < > 10.5* 10.4*  HCT 29.5*   < > 31.0* 30.8*  PLT 131*  --   --  120*   < > = values in this interval not displayed.   BMET:  Recent Labs    02/14/19 0347  02/14/19 1714  02/15/19 0207 02/15/19 0403  NA 138   < > 134*   < > 134* 132*  K 4.6   < > 4.9   < > 4.5 4.4  CL 105   < > 98  --   --  102  CO2 25  --   --   --   --  22  GLUCOSE 116*   < > 123*  --   --  104*  BUN 14   < > 13  --    --  11  CREATININE 1.15   < > 1.10  --   --  1.09  CALCIUM 9.3  --   --   --   --  8.0*   < > = values in this interval not displayed.    PT/INR:  Recent Labs    02/14/19 1841  LABPROT 15.9*  INR 1.3*   ABG    Component Value Date/Time   PHART 7.375 02/15/2019 0207   HCO3 23.1 02/15/2019 0207   TCO2 24 02/15/2019 0207   ACIDBASEDEF 2.0 02/15/2019 0207   O2SAT 98.0 02/15/2019 0207   CBG (last 3)  Recent Labs    02/15/19 0411 02/15/19 0458 02/15/19 0641  GLUCAP 107* 98 107*    Assessment/Plan: S/P Procedure(s) (LRB): CORONARY ARTERY BYPASS GRAFTING (CABG) TIMES THREE USING LEFT INTERNAL MAMMARY ARTERY AND ENDOSCOPIC HARVESTING OF RIGHT GREATER SAPHENOUS VEIN (N/A) TRANSESOPHAGEAL ECHOCARDIOGRAM (TEE) (N/A) Mobilize d/c PA catheter; pain control   LOS: 3 days    Roger [name] 02/15/2019

## 2019-02-15 NOTE — Procedures (Signed)
Extubation Procedure Note  Patient Details:   Name: Roger [name] DOB: 12/06/1945 MRN: [id number]   Airway Documentation:    Vent end date: 02/15/19 Vent end time: 0010   Evaluation  O2 sats: stable throughout Complications: No apparent complications Patient did tolerate procedure well. Bilateral Breath Sounds: Clear, Diminished   Yes. Pt had cuff leak, NIF/VC good effort. No stridor noted post extubation, pt had strong productive cough. Pt placed on [place] 4 Lpm w/humidity. RT will continue to monitor.   Roby Lofts Anayelli Lai 02/15/2019, 12:15 AM

## 2019-02-15 NOTE — Progress Notes (Signed)
Progress Note  Patient Name: Roger [name] Date of Encounter: 02/15/2019  Primary Cardiologist: Shelva Majestic, MD   Subjective   Day 1 s/p urgent CABG with USAP on iv NTG/heparin; feels well, alert,  Inpatient Medications    Scheduled Meds: . acetaminophen  1,000 mg Oral Q6H   Or  . acetaminophen (TYLENOL) oral liquid 160 mg/5 mL  1,000 mg Per Tube Q6H  . aspirin EC  325 mg Oral Daily  . bisacodyl  10 mg Oral Daily   Or  . bisacodyl  10 mg Rectal Daily  . Chlorhexidine Gluconate Cloth  6 each Topical Daily  . docusate sodium  200 mg Oral Daily  . insulin aspart  0-24 Units Subcutaneous Q4H  . lisinopril  5 mg Oral Daily  . mouth rinse  15 mL Mouth Rinse BID  . metoprolol tartrate  12.5 mg Oral BID   Or  . metoprolol tartrate  12.5 mg Per Tube BID  . mupirocin ointment  1 application Nasal BID  . [START ON 02/16/2019] pantoprazole  40 mg Oral Daily  . rosuvastatin  40 mg Oral q1800  . sodium chloride flush  3 mL Intravenous Once  . sodium chloride flush  3 mL Intravenous Q12H  . sodium chloride flush  3 mL Intravenous Q12H  . sodium chloride flush  3 mL Intravenous Q12H  . sodium chloride flush  3 mL Intravenous Q12H   Continuous Infusions: . sodium chloride Stopped (02/14/19 2216)  . sodium chloride    . sodium chloride    . sodium chloride    . sodium chloride 20 mL/hr at 02/14/19 1842  . albumin human 12.5 g (02/14/19 2257)  . cefUROXime (ZINACEF)  IV Stopped (02/14/19 2129)  . lactated ringers    . lactated ringers    . lactated ringers 10 mL/hr at 02/15/19 0700  . niCARDipine Stopped (02/14/19 2151)  . nitroGLYCERIN Stopped (02/14/19 1933)  . phenylephrine (NEO-SYNEPHRINE) Adult infusion Stopped (02/14/19 1825)  . sodium chloride     PRN Meds: sodium chloride, sodium chloride, sodium chloride, albumin human, influenza vaccine adjuvanted, lactated ringers, methocarbamol, metoprolol tartrate, midazolam, morphine injection, ondansetron (ZOFRAN) IV,  oxyCODONE, sodium chloride flush, sodium chloride flush, sodium chloride flush, traMADol   Vital Signs    Vitals:   02/15/19 0400 02/15/19 0500 02/15/19 0600 02/15/19 0700  BP: 107/63 120/74 106/60 123/75  Pulse: (!) 101 100 (!) 102 (!) 104  Resp: 12 (!) 22 (!) 21 (!) 28  Temp: 99.1 F (37.3 C) 99.3 F (37.4 C) 99.7 F (37.6 C) 99.7 F (37.6 C)  TempSrc: Core     SpO2: 99% 100% 100% 100%  Weight:  78.7 kg    Height:        Intake/Output Summary (Last 24 hours) at 02/15/2019 0807 Last data filed at 02/15/2019 0700 Gross per 24 hour  Intake 4698.94 ml  Output 3460 ml  Net 1238.94 ml   Last 3 Weights 02/15/2019 02/14/2019 02/13/2019  Weight (lbs) 173 lb 8 oz 168 lb 14.4 oz 165 lb 11.2 oz  Weight (kg) 78.7 kg 76.613 kg 75.161 kg      Telemetry    Sinus in the 90s - Personally Reviewed  ECG    02/15/2019 ECG (independently read by me): Sinus at 100 with normalization of T wave abnormalities  02/14/2019 ECG (independently read by me): NSR at 83 with new anterolateral T wave inversion  Physical Exam   BP 123/75   Pulse (!) 104  Temp 99.7 F (37.6 C)   Resp (!) 28   Ht 5\' 10"  (1.778 m)   Wt 78.7 kg   SpO2 100%   BMI 24.89 kg/m  General: Alert, oriented, no distress.  Skin: normal turgor, no rashes, warm and dry HEENT: Normocephalic, atraumatic. Pupils equal round and reactive to light; sclera anicteric; extraocular muscles intact; Nose without nasal septal hypertrophy Mouth/Parynx benign; Mallinpatti scale 3 Neck: No JVD, no carotid bruits; normal carotid upstroke Lungs: slightly decreased BS at bases; no wheezing or rales Chest wall:stable stenotomy site Heart: PMI not displaced, RRR, s1 s2 normal, 1/6 systolic murmur, no diastolic murmur, no rubs, gallops, thrills, or heaves Abdomen: soft, nontender; no hepatosplenomehaly, BS+; abdominal aorta nontender and not dilated by palpation. Back: no CVA tenderness Pulses 2+ Musculoskeletal: full range of motion,  normal strength, no joint deformities Extremities: no clubbing cyanosis or edema, Homan's sign negative  Neurologic: grossly nonfocal; Cranial nerves grossly wnl Psychologic: Normal mood and affect   Labs    High Sensitivity Troponin:   Recent Labs  Lab 02/12/19 1259 02/12/19 1522 02/12/19 2116 02/12/19 2317 02/14/19 1143  TROPONINIHS 35* 75* 103* 97* 18*      Chemistry Recent Labs  Lab 02/13/19 0233 02/14/19 0347  02/14/19 1629 02/14/19 1714  02/15/19 0003 02/15/19 0207 02/15/19 0403  NA 137 138   < > 133* 134*   < > 134* 134* 132*  K 3.4* 4.6   < > 5.2* 4.9   < > 4.1 4.5 4.4  CL 100 105   < > 97* 98  --   --   --  102  CO2 25 25  --   --   --   --   --   --  22  GLUCOSE 132* 116*   < > 126* 123*  --   --   --  104*  BUN 18 14   < > 12 13  --   --   --  11  CREATININE 1.29* 1.15   < > 1.00 1.10  --   --   --  1.09  CALCIUM 9.3 9.3  --   --   --   --   --   --  8.0*  GFRNONAA 56* >60  --   --   --   --   --   --  >60  GFRAA >60 >60  --   --   --   --   --   --  >60  ANIONGAP 12 8  --   --   --   --   --   --  8   < > = values in this interval not displayed.     Hematology Recent Labs  Lab 02/14/19 0347  02/14/19 1619  02/14/19 1841  02/15/19 0003 02/15/19 0207 02/15/19 0403  WBC 9.1  --   --   --  12.2*  --   --   --  8.7  RBC 3.57*  --   --   --  2.89*  --   --   --  2.94*  HGB 12.6*   < > 8.6*   < > 10.3*   < > 9.9* 10.5* 10.4*  HCT 36.8*   < > 24.5*   < > 29.5*   < > 29.0* 31.0* 30.8*  MCV 103.1*  --   --   --  102.1*  --   --   --  104.8[facility]  35.3*  --   --   --  35.6*  --   --   --  35.4*  MCHC 34.2  --   --   --  34.9  --   --   --  33.8  RDW 12.9  --   --   --  12.5  --   --   --  12.8  PLT 211  --  150  --  131*  --   --   --  120*   < > = values in this interval not displayed.    BNP Recent Labs  Lab 02/12/19 1259  BNP 59.6    Lipid Panel  No results found for: CHOL, TRIG, HDL, CHOLHDL, VLDL, LDLCALC, LDLDIRECT, LABVLDL  DDimer No  results for input(s): DDIMER in the last 168 hours.   Radiology    Dg Chest Port 1 View  Result Date: 02/15/2019 CLINICAL DATA:  Chest tube EXAM: PORTABLE CHEST 1 VIEW COMPARISON:  Yesterday FINDINGS: Tracheal and esophageal extubation. Swan-Ganz catheter from right IJ approach with tip at the right main pulmonary artery. Thoracic drains in place. No convincing pneumothorax. Atelectasis at both lung bases that is unchanged. Negative for edema. IMPRESSION: 1. Stable remaining hardware. 2. Stable atelectasis.  No visible pneumothorax. Electronically Signed   By: Monte Fantasia M.D.   On: 02/15/2019 06:55   Dg Chest Port 1 View  Result Date: 02/14/2019 CLINICAL DATA:  Post CABG. EXAM: PORTABLE CHEST 1 VIEW COMPARISON:  02/12/2019 FINDINGS: Endotracheal tube is roughly 3.9 cm above the carina. Nasogastric tube extends into the abdomen. Right jugular central line with the pulmonary catheter in the right pulmonary artery. Evidence for mediastinal and left chest tubes. Negative for pneumothorax. Densities at the left lung base are most compatible with atelectasis. Negative for a large pneumothorax. IMPRESSION: 1. Support apparatuses appear to be appropriately positioned. 2. Chest tubes without a pneumothorax. 3. Left basilar densities are most compatible with volume loss. Electronically Signed   By: Markus Daft M.D.   On: 02/14/2019 19:12   Vas US Doppler Pre Cabg  Result Date: 02/14/2019 PREOPERATIVE VASCULAR EVALUATION  Risk Factors:     Coronary artery disease. Comparison Study: No prior. Performing Technologist: Oda Cogan RDMS, RVT  Examination Guidelines: A complete evaluation includes B-mode imaging, spectral Doppler, color Doppler, and power Doppler as needed of all accessible portions of each vessel. Bilateral testing is considered an integral part of a complete examination. Limited examinations for reoccurring indications may be performed as noted.  Right Carotid Findings:  +----------+--------+--------+--------+--------+------------------+           PSV cm/sEDV cm/sStenosisDescribeComments           +----------+--------+--------+--------+--------+------------------+ CCA Prox  115     16                                         +----------+--------+--------+--------+--------+------------------+ CCA Distal47      13                      intimal thickening +----------+--------+--------+--------+--------+------------------+ ICA Prox  42      16      1-39%           intimal thickening +----------+--------+--------+--------+--------+------------------+ ICA Distal65      22                                         +----------+--------+--------+--------+--------+------------------+  ECA       88      14                                         +----------+--------+--------+--------+--------+------------------+ Portions of this table do not appear on this page. +----------+--------+-------+----------------+------------+           PSV cm/sEDV cmsDescribe        Arm Pressure +----------+--------+-------+----------------+------------+ Subclavian88             Multiphasic, WNL             +----------+--------+-------+----------------+------------+ +---------+--------+--+--------+--+---------+ VertebralPSV cm/s43EDV cm/s18Antegrade +---------+--------+--+--------+--+---------+ Left Carotid Findings: +----------+--------+--------+--------+--------+------------------+           PSV cm/sEDV cm/sStenosisDescribeComments           +----------+--------+--------+--------+--------+------------------+ CCA Prox  63      15                                         +----------+--------+--------+--------+--------+------------------+ CCA Distal56      16                      intimal thickening +----------+--------+--------+--------+--------+------------------+ ICA Prox  57      21      1-39%   calcific                    +----------+--------+--------+--------+--------+------------------+ ICA Distal55      24                                         +----------+--------+--------+--------+--------+------------------+ ECA       74      12                                         +----------+--------+--------+--------+--------+------------------+ +----------+--------+--------+----------------+------------+ SubclavianPSV cm/sEDV cm/sDescribe        Arm Pressure +----------+--------+--------+----------------+------------+           86              Multiphasic, WNL             +----------+--------+--------+----------------+------------+ +---------+--------+--+--------+--+---------+ VertebralPSV cm/s38EDV cm/s16Antegrade +---------+--------+--+--------+--+---------+  Summary: Right Carotid: Velocities in the right ICA are consistent with a 1-39% stenosis. Left Carotid: Velocities in the left ICA are consistent with a 1-39% stenosis. Vertebrals: Bilateral vertebral arteries demonstrate antegrade flow.  Electronically signed by Curt Jews MD on 02/14/2019 at 3:06:08 PM.    Final     Cardiac Studies   02/13/19 cardiac cath  There is mild to moderate left ventricular systolic dysfunction.  LV end diastolic pressure is normal.  The left ventricular ejection fraction is ~40-45% by visual estimate. Anterior hypokinesis.  ----------------------------------------------  Ost LAD to Prox LAD lesion is 80% stenosed.  Prox LAD to Mid LAD lesion is 70% stenosed with 50% stenosed side branch in 1st Diag.  Ramus lesion is 30% stenosed.  Otherwise normal coronaries   SUMMARY  Severe ostial and proximal LAD with extensive disease segment.  Otherwise normal ramus intermedius and dominant circumflex.  Excellent LAD and diagonal targets  Mildly reduced EF with anterior hypokinesis.  Normal LVEDP  RECOMMENDATIONS  Return to nursing unit, restart IV heparin 8 hours after sheath removal.  Discussed  images with Dr. Claiborne Billings, who agrees that prior to considering relatively high risk ostial LAD with very long lesion to cover with stents, the best option will be to consider CVTS consultation with PCI as a backup option.  Continue aggressive restart medication.  Diagnostic Dominance: Left   Echo 02/13/19 IMPRESSIONS   1. Left ventricular ejection fraction, by visual estimation, is 40 to 45%. The left ventricle has mild to moderately decreased function. Normal left ventricular size. There is mildly increased left ventricular hypertrophy.  2. Left ventricular diastolic Doppler parameters are consistent with impaired relaxation pattern of LV diastolic filling.  3. Global right ventricle has normal systolic function.The right ventricular size is normal. No increase in right ventricular wall thickness.  4. Left atrial size was normal.  5. Right atrial size was normal.  6. The mitral valve is normal in structure. Trace mitral valve regurgitation.  7. The tricuspid valve is normal in structure. Tricuspid valve regurgitation is trivial.  8. The aortic valve is tricuspid Aortic valve regurgitation was not visualized by color flow Doppler.  9. Normal pulmonary artery systolic pressure. 10. The inferior vena cava is normal in size with greater than 50% respiratory variability, suggesting right atrial pressure of 3 mmHg.  FINDINGS  Left Ventricle: Left ventricular ejection fraction, by visual estimation, is 40 to 45%. The left ventricle has mild to moderately decreased function. There is mildly increased left ventricular hypertrophy. Concentric left ventricular hypertrophy. Normal  left ventricular size. Spectral Doppler shows Left ventricular diastolic Doppler parameters are consistent with impaired relaxation pattern of LV diastolic filling. Normal left ventricular filling pressures.  Right Ventricle: The right ventricular size is normal. No increase in right ventricular wall thickness. Global RV  systolic function is has normal systolic function. The tricuspid regurgitant velocity is 2.05 m/s, and with an assumed right atrial pressure  of 3 mmHg, the estimated right ventricular systolic pressure is normal at 19.8 mmHg.  Left Atrium: Left atrial size was normal in size.  Right Atrium: Right atrial size was normal in size  Pericardium: There is no evidence of pericardial effusion.  Mitral Valve: The mitral valve is normal in structure. Trace mitral valve regurgitation.  Tricuspid Valve: The tricuspid valve is normal in structure. Tricuspid valve regurgitation is trivial by color flow Doppler.  Aortic Valve: The aortic valve is tricuspid. Aortic valve regurgitation was not visualized by color flow Doppler.  Pulmonic Valve: The pulmonic valve was grossly normal. Pulmonic valve regurgitation is not visualized by color flow Doppler.  Aorta: The aortic root is normal in size and structure.  Venous: The inferior vena cava is normal in size with greater than 50% respiratory variability, suggesting right atrial pressure of 3 mmHg.  IAS/Shunts: The atrial septum is grossly normal.    Patient Profile     73 y.o. male with a PMH of borderline HTN, HLD, and chronic back pain who is being seen today for the evaluation of chest pain and SOB, cath on the 13th with ostial LAD lesion await TCTS consult.  Assessment & Plan    1. Unstable angina/ day 1 s/p urgent CABG: Appreciate care of Dr. Orvan Seen with LIMA to LAD, SVG - dx and SVR to ramus.  Doing well; extubated, off pressors  2. Mild LV dysfunction;  Echo day of cath 40 - 45%; post op TEE 35 - 40%;  Suspect stunning rather than infarction.  ECG normalized today;  Suspect there will be recovery of LV function. Lisinopril to be started today as well as metoprolol.  3. Hyperlipidemia: on rosuvastatin 40 mg;  Will check labs in 4 weeks   4. Essential HTN: stable today  5. Anemia: no bleeding post op; H/H 10.4/30.8  6. Post  op CXR: stable atelectasis; no effusion, no pneumo.  For questions or updates, please contact [place] Please consult [email or web address] for contact info under    Signed, Shelva Majestic, MD  02/15/2019, 8:07 AM

## 2019-02-16 ENCOUNTER — Inpatient Hospital Stay (HOSPITAL_COMMUNITY): Payer: Medicare Other

## 2019-02-16 DIAGNOSIS — I480 Paroxysmal atrial fibrillation: Secondary | ICD-10-CM

## 2019-02-16 LAB — BASIC METABOLIC PANEL
Anion gap: 10 (ref 5–15)
BUN: 18 mg/dL (ref 8–23)
CO2: 22 mmol/L (ref 22–32)
Calcium: 8 mg/dL — ABNORMAL LOW (ref 8.9–10.3)
Chloride: 96 mmol/L — ABNORMAL LOW (ref 98–111)
Creatinine, Ser: 1.34 mg/dL — ABNORMAL HIGH (ref 0.61–1.24)
GFR calc Af Amer: >60 mL/min (ref >60)
GFR calc non Af Amer: 53 mL/min — ABNORMAL LOW (ref >60)
Glucose, Bld: 117 mg/dL — ABNORMAL HIGH (ref 70–99)
Potassium: 4 mmol/L (ref 3.5–5.1)
Sodium: 128 mmol/L — ABNORMAL LOW (ref 135–145)

## 2019-02-16 LAB — CBC WITH DIFFERENTIAL/PLATELET
Abs Immature Granulocytes: 0.04 10*3/uL (ref 0.00–0.07)
Basophils Absolute: 0 10*3/uL (ref 0.0–0.1)
Basophils Relative: 0 %
Eosinophils Absolute: 0 10*3/uL (ref 0.0–0.5)
Eosinophils Relative: 0 %
HCT: 29.6 % — ABNORMAL LOW (ref 39.0–52.0)
Hemoglobin: 9.8 g/dL — ABNORMAL LOW (ref 13.0–17.0)
Immature Granulocytes: 0 %
Lymphocytes Relative: 17 %
Lymphs Abs: 1.7 10*3/uL (ref 0.7–4.0)
MCH: 35 pg — ABNORMAL HIGH (ref 26.0–34.0)
MCHC: 33.1 g/dL (ref 30.0–36.0)
MCV: 105.7 fL — ABNORMAL HIGH (ref 80.0–100.0)
Monocytes Absolute: 1 10*3/uL (ref 0.1–1.0)
Monocytes Relative: 10 %
Neutro Abs: 7.4 10*3/uL (ref 1.7–7.7)
Neutrophils Relative %: 73 %
Platelets: 125 10*3/uL — ABNORMAL LOW (ref 150–400)
RBC: 2.8 MIL/uL — ABNORMAL LOW (ref 4.22–5.81)
RDW: 12.9 % (ref 11.5–15.5)
WBC: 10.2 10*3/uL (ref 4.0–10.5)
nRBC: 0 % (ref 0.0–0.2)

## 2019-02-16 LAB — GLUCOSE, CAPILLARY
Glucose-Capillary: 113 mg/dL — ABNORMAL HIGH (ref 70–99)
Glucose-Capillary: 128 mg/dL — ABNORMAL HIGH (ref 70–99)
Glucose-Capillary: 131 mg/dL — ABNORMAL HIGH (ref 70–99)
Glucose-Capillary: 146 mg/dL — ABNORMAL HIGH (ref 70–99)
Glucose-Capillary: 94 mg/dL (ref 70–99)
Glucose-Capillary: 95 mg/dL (ref 70–99)
Glucose-Capillary: 95 mg/dL (ref 70–99)

## 2019-02-16 LAB — VITAMIN B12: Vitamin B-12: 116 pg/mL — ABNORMAL LOW (ref 180–914)

## 2019-02-16 LAB — FOLATE: Folate: 6.5 ng/mL (ref >5.9)

## 2019-02-16 MED ORDER — AMIODARONE HCL IN DEXTROSE 360-4.14 MG/200ML-% IV SOLN
60.0000 mg/h | INTRAVENOUS | Status: AC
  Administered 2019-02-16: 60 mg/h via INTRAVENOUS

## 2019-02-16 MED ORDER — AMIODARONE HCL IN DEXTROSE 360-4.14 MG/200ML-% IV SOLN
30.0000 mg/h | INTRAVENOUS | Status: DC
  Filled 2019-02-16 (×5): qty 200

## 2019-02-16 MED ORDER — METOPROLOL TARTRATE 25 MG/10 ML ORAL SUSPENSION: 12.5000 mg | Freq: Three times a day (TID) | ORAL | Status: DC

## 2019-02-16 MED ORDER — AMIODARONE LOAD VIA INFUSION
150.0000 mg | Freq: Once | INTRAVENOUS | Status: AC
  Administered 2019-02-16: 150 mg via INTRAVENOUS
  Filled 2019-02-16: qty 83.34

## 2019-02-16 MED ORDER — METOPROLOL TARTRATE 12.5 MG HALF TABLET
12.5000 mg | ORAL_TABLET | Freq: Three times a day (TID) | ORAL | Status: DC
  Administered 2019-02-16 (×2): 12.5 mg via ORAL
  Filled 2019-02-16 (×3): qty 1

## 2019-02-16 MED ORDER — FUROSEMIDE 10 MG/ML IJ SOLN
40.0000 mg | Freq: Two times a day (BID) | INTRAMUSCULAR | Status: DC
  Administered 2019-02-16 (×2): 40 mg via INTRAVENOUS
  Filled 2019-02-16 (×2): qty 4

## 2019-02-16 MED ORDER — LISINOPRIL 2.5 MG PO TABS: 2.5000 mg | ORAL_TABLET | Freq: Every day | ORAL | Status: DC

## 2019-02-16 MED FILL — Lidocaine HCl Local Soln Prefilled Syringe 100 MG/5ML (2%): INTRAMUSCULAR | Qty: 5 | Status: AC

## 2019-02-16 MED FILL — Heparin Sodium (Porcine) Inj 1000 Unit/ML: INTRAMUSCULAR | Qty: 20 | Status: AC

## 2019-02-16 MED FILL — Heparin Sodium (Porcine) Inj 1000 Unit/ML: INTRAMUSCULAR | Qty: 30 | Status: AC

## 2019-02-16 MED FILL — Sodium Bicarbonate IV Soln 8.4%: INTRAVENOUS | Qty: 50 | Status: AC

## 2019-02-16 MED FILL — Electrolyte-R (PH 7.4) Solution: INTRAVENOUS | Qty: 3000 | Status: AC

## 2019-02-16 MED FILL — Magnesium Sulfate Inj 50%: INTRAMUSCULAR | Qty: 10 | Status: AC

## 2019-02-16 MED FILL — Sodium Chloride IV Soln 0.9%: INTRAVENOUS | Qty: 3000 | Status: AC

## 2019-02-16 MED FILL — Mannitol IV Soln 20%: INTRAVENOUS | Qty: 500 | Status: AC

## 2019-02-16 MED FILL — Potassium Chloride Inj 2 mEq/ML: INTRAVENOUS | Qty: 40 | Status: AC

## 2019-02-16 NOTE — Progress Notes (Signed)
CT surgery p.m. Rounds  Patient progressing 2 days after urgent CABG x3 Started on IV amiodarone protocol for postoperative atrial fibrillation earlier today Vital signs now stable, continue current care

## 2019-02-16 NOTE — Discharge Summary (Addendum)
Physician Discharge Summary  Patient ID: Roger [name] MRN: [id number] DOB/AGE: [birth year] 73 y.o.  Admit date: 02/12/2019 Discharge date: 02/19/2019  Admission Diagnoses: Unstable angina pectoris  Discharge Diagnoses:    Chest pain   SOB (shortness of breath) on exertion   Unstable angina (HCC)   Coronary artery disease involving native coronary artery of native heart with unstable angina pectoris (HCC)   Macrocytosis   S/P CABG x 3 Expected acute blood loss anemia Thrombocytopenia  Discharged Condition: stable  History of Present Illness: Roger [name] is a 73 y.o. male with a PMH of  HTN, HLD, and chronic back pain. He presented to the ED on 02/12/19 with a complaint of intermittent chest pain occurring with exertion and relieved with rest during the preceding two weeks.  Evaluation in the ED included high-sensitivity troponin initially eleveted at 35 and increasing to 75.  EKG showed non-specific ST-t wave abnormalities. His CXR was consistent with early interstitial edema, cardiomegaly, and pulmonary HTN c/w acute congestive heart failure.  His pain improved in the ED with NTG sublingual x 1.  Hospital Course:  He underwent LHC on 02/13/19 to assess recent onset exertional chest pain of about ; this demonstrated complex LAD stenoses. Decision was to refer for CABG, but he developed worsening CP early am on 02/14/19 despite heparin and NTG gtts. Consult received for consideration of urgent CABG. He remained hemodynamically stable. After evaluation by Dr. Julien Girt, the patient was taken to the OR where urgent CABG x 3 was performed without complication. The LIMA was grafted to the LAD and saphenous grafts were placed to thel first diagonal and ramus intermediate arteries. He tolerated the procedure well and separated from cardiopulmonary bypass without difficulty. He was transferred to the CVICU where he remained hemodynamically stable.  He was extubated routinely at around  midnight on the day of surgery. On post-op day 1, the PA catheter was removed and the patient was mobilized. He later developed atrial fibrillation and was started on an amiodarone infusion after a standard 157m IV bolus. This resulted in conversion to NSR.  He was transitioned to an oral regimen of Amiodarone. His mobility improved and he was eventulaly transferred to 4E progressive care.  The patient continued to make good progress.  He remains in NSR.  His pacing wires have been removed without difficulty.  He developed a mild increase in his creatinine.  This was felt to be due to diuretics and his Lasix was discontinued.  His creatinine at time of discharge was decreased to 1.23.  He continues to ambulate independently.  He did get dizzy while walking with cardiac rehab earlier today. He was not hypotensive. Patient states he has not been dizzy since and he walked with PT in the afternoon and did not have any further dizziness.  He did have complaints of a weak right hand, which he has had since his surgery. He is able to move his right arm and hand, but his grip is very weak in his right hand. Motor/sensory intact LUE and sensory intact RUE. Unsure of the etiology but may be related to positioning. As discussed with Dr. AOrvan Seen PT asked to evaluate. Home PT has been arranged. His incisions are healing without evidence of infection. His Lopressor was decreased on 10/19 as his HR was mostly in the 60's.  Patient will discuss with Dr. AOrvan Seenon 10/23 when it is ok to restart Meloxicam. Chest tube sutures will be removed in the office after discharge. As discussed  with Dr. Orvan Seen, he is felt to be medically stable for discharge home today.   Significant Diagnostic Studies:   LEFT HEART CATH AND CORONARY ANGIOGRAPHY  Conclusion    There is mild to moderate left ventricular systolic dysfunction.  LV end diastolic pressure is normal.  The left ventricular ejection fraction is ~40-45% by visual  estimate. Anterior hypokinesis.  ----------------------------------------------  Ost LAD to Prox LAD lesion is 80% stenosed.  Prox LAD to Mid LAD lesion is 70% stenosed with 50% stenosed side branch in 1st Diag.  Ramus lesion is 30% stenosed.  Otherwise normal coronaries   SUMMARY  Severe ostial and proximal LAD with extensive disease segment.  Otherwise normal ramus intermedius and dominant circumflex.  Excellent LAD and diagonal targets  Mildly reduced EF with anterior hypokinesis.  Normal LVEDP  RECOMMENDATIONS  Return to nursing unit, restart IV heparin 8 hours after sheath removal.  Discussed images with Dr. Claiborne Billings, who agrees that prior to considering relatively high risk ostial LAD with very long lesion to cover with stents, the best option will be to consider CVTS consultation with PCI as a backup option.  Continue aggressive restart medication.   Glenetta Hew, MD    Recommendations  Antiplatelet/Anticoag Recommend Aspirin 70m daily for moderate CAD.  Surgeon Notes    02/14/2019 7:37 PM Op Note signed by AWonda Olds MD  Indications  Unstable angina ([place] [I20.0 (ICD-10-CM)]  Procedural Details  Technical Details Primary Care Provider: BRosine DoorPrimary Cardiologist:  TShelva Majestic MD  73y.o. male w/ hx borderline HTN, HLD, chronic back pain was admitted 10/12 w/ chest pain & SOB concerning for unstable angina.  Was seen by Dr. KClaiborne Billingsin consultation and referred for cardiac catheterization.  Time Out: Verified patient identification, verified procedure, site/side was marked, verified correct patient position, special equipment/implants available, medications/allergies/relevent history reviewed, required imaging and test results available. Performed.  Access:  * RIGHT Radial Artery: 6 Fr sheath -- Seldinger technique using Micropuncture Kit -- Direct ultrasound guidance used.  Permanent image obtained and placed on chart. ---10 mL radial  cocktail IA; 4000 Units IV Heparin   Left Heart Catheterization: A 5Fr TIG 4.0 Catheter was advanced or exchanged over a J-wire under direct fluoroscopic guidance into the ascending aorta; the aortic valve was crossed first for LV hemodynamics and LV gram.  Catheter was then pulled back for selective coronary cineangiography of both the left and right coronary artery.  Angiography was reviewed with Dr. KClaiborne Billings  Concerning ostial and extensive proximal LAD disease.  Recommendation for now is obtained CVTS consultation with PCI as backup.  Upon completion of Angiogaphy, the catheter was removed completely out of the body over a wire, without complication.  Radial sheath removed in the Cardiac Catheterization lab with TR Band placed for hemostasis.  TR Band: 1230 Hours; 13 mL air  MEDICATIONS * SQ Lidocaine 313m* Radial Cocktail: 3 mg Verapmil in 10 mL NS * Isovue Contrast: 60 mL * Heparin: 4000 units  Fluoro time: 3.1 minutes. Dose Area Product: [id number]. Cumulative Air Kerma: 316 mGy.  Estimated blood loss <50 mL.   During this procedure medications were administered to achieve and maintain moderate conscious sedation while the patient's heart rate, blood pressure, and oxygen saturation were continuously monitored and I was present face-to-face 100% of this time.  Medications (Filter: Administrations occurring from 02/13/19 1137 to 02/13/19 1238) (important)  Continuous medications are totaled by the amount administered until 02/13/19 1238.  Medication Rate/Dose/Volume Action  Date  Time   Heparin (Porcine) in NaCl 1000-0.9 UT/500ML-% SOLN (mL) 500 mL Given 02/13/19 1144   Total dose as of 02/13/19 1238        500 mL        Heparin (Porcine) in NaCl 1000-0.9 UT/500ML-% SOLN (mL) 500 mL Given 02/13/19 1145   Total dose as of 02/13/19 1238        500 mL        midazolam (VERSED) injection (mg) 2 mg Given 02/13/19 1150   Total dose as of 02/13/19 1238        2 mg        fentaNYL  (SUBLIMAZE) injection (mcg) 50 mcg Given 02/13/19 1150   Total dose as of 02/13/19 1238        50 mcg        lidocaine (PF) (XYLOCAINE) 1 % injection (mL) 2 mL Given 02/13/19 1204   Total dose as of 02/13/19 1238        2 mL        Radial Cocktail/Verapamil only (mL) 10 mL Given 02/13/19 1205   Total dose as of 02/13/19 1238        10 mL        heparin injection (Units) 4,000 Units Given 02/13/19 1207   Total dose as of 02/13/19 1238        4,000 Units        iohexol (OMNIPAQUE) 350 MG/ML injection (mL) 60 mL Given 02/13/19 1228   Total dose as of 02/13/19 1238        60 mL        Sedation Time  Sedation Time Physician-1: 37 minutes 4 seconds  Contrast  Medication Name Total Dose  iohexol (OMNIPAQUE) 350 MG/ML injection 60 mL    Radiation/Fluoro  Fluoro time: 3.1 (min) DAP: 16614 (mGycm2) Cumulative Air Kerma: 967 (mGy)  Complications  Complications documented before study signed (02/13/2019 59:16 PM)   No complications were associated with this study.  Documented by Leonie Man, MD - 02/13/2019 12:45 PM    Coronary Findings  Diagnostic Dominance: Left Left Main  Vessel is large.  Left Anterior Descending  Ost LAD to Prox LAD lesion 80% stenosed  Ost LAD to Prox LAD lesion is 80% stenosed. The lesion is located at the major branch and focal.  Prox LAD to Mid LAD lesion 70% stenosed with side branch in 1st Diag 50% stenosed  Prox LAD to Mid LAD lesion is 70% stenosed with 50% stenosed side branch in 1st Diag. The lesion is segmental and eccentric.  First Diagonal Branch  Vessel is moderate in size.  First Septal Branch  Vessel is small in size.  Left Anterior Descending  Ost LAD to Prox LAD lesion 80% stenosed  Ost LAD to Prox LAD lesion is 80% stenosed. The lesion is located at the major branch and focal.  Prox LAD to Mid LAD lesion 70% stenosed with side branch in 1st Diag 50% stenosed  Prox LAD to Mid LAD lesion is 70% stenosed with 50% stenosed side  branch in 1st Diag. The lesion is segmental and eccentric.  First Diagonal Branch  Vessel is moderate in size.  First Septal Branch  Vessel is small in size.  Ramus Intermedius  Vessel is normal in caliber. Vessel is angiographically normal.  Ramus lesion 30% stenosed  Ramus lesion is 30% stenosed.  Left Circumflex  Vessel is large. Vessel is angiographically normal.  First Obtuse Marginal Branch  Vessel is  small in size.  Left Posterior Descending Artery  Vessel is moderate in size. Vessel is angiographically normal.  First Left Posterolateral Branch  Vessel is angiographically normal.  Left Posterior Atrioventricular Artery  Vessel is angiographically normal.  Right Coronary Artery  Vessel is small.  Acute Marginal Branch  Vessel is small in size.  Right Ventricular Branch  Vessel is small in size.  Intervention  No interventions have been documented. Wall Motion  Resting               Left Heart  Left Ventricle The left ventricular size is in the upper limits of normal. There is mild to moderate left ventricular systolic dysfunction. LV end diastolic pressure is normal. The left ventricular ejection fraction is 45-50% by visual estimate. There are LV function abnormalities due to segmental dysfunction. There is trivial (1+) mitral regurgitation.  Aortic Valve There is no aortic valve stenosis. There is normal aortic valve motion.  Coronary Diagrams  Diagnostic Dominance: Left      ECHOCARDIOGRAM REPORT       Patient Name:   Roger [name] Osorto Date of Exam: 02/13/2019 Medical Rec #:  [id number]         Height:       70.0 in Accession #:    [id number]        Weight:       165.7 lb Date of Birth:  1945-08-28        BSA:          1.93 m Patient Age:    41 years          BP:           164/78 mmHg Patient Gender: M                 HR:           79 bpm. Exam Location:  Inpatient  Procedure: 2D Echo, Cardiac Doppler and Color Doppler  Indications:    I51.7  Cardiomegaly   History:        Patient has no prior history of Echocardiogram examinations. CAD                 Signs/Symptoms:Chest Pain and Dyspnea. Patient is post heart                 catheterization.   Sonographer:    Roseanna Rainbow RDCS Referring Phys: [phone] ALI HIJAZI    Sonographer Comments: Suboptimal apical window. Restricted mobility. IMPRESSIONS    1. Left ventricular ejection fraction, by visual estimation, is 40 to 45%. The left ventricle has mild to moderately decreased function. Normal left ventricular size. There is mildly increased left ventricular hypertrophy.  2. Left ventricular diastolic Doppler parameters are consistent with impaired relaxation pattern of LV diastolic filling.  3. Global right ventricle has normal systolic function.The right ventricular size is normal. No increase in right ventricular wall thickness.  4. Left atrial size was normal.  5. Right atrial size was normal.  6. The mitral valve is normal in structure. Trace mitral valve regurgitation.  7. The tricuspid valve is normal in structure. Tricuspid valve regurgitation is trivial.  8. The aortic valve is tricuspid Aortic valve regurgitation was not visualized by color flow Doppler.  9. Normal pulmonary artery systolic pressure. 10. The inferior vena cava is normal in size with greater than 50% respiratory variability, suggesting right atrial pressure of 3 mmHg.  FINDINGS  Left Ventricle: Left ventricular ejection fraction, by visual  estimation, is 40 to 45%. The left ventricle has mild to moderately decreased function. There is mildly increased left ventricular hypertrophy. Concentric left ventricular hypertrophy. Normal  left ventricular size. Spectral Doppler shows Left ventricular diastolic Doppler parameters are consistent with impaired relaxation pattern of LV diastolic filling. Normal left ventricular filling pressures.  Right Ventricle: The right ventricular size is normal. No increase in  right ventricular wall thickness. Global RV systolic function is has normal systolic function. The tricuspid regurgitant velocity is 2.05 m/s, and with an assumed right atrial pressure  of 3 mmHg, the estimated right ventricular systolic pressure is normal at 19.8 mmHg.  Left Atrium: Left atrial size was normal in size.  Right Atrium: Right atrial size was normal in size  Pericardium: There is no evidence of pericardial effusion.  Mitral Valve: The mitral valve is normal in structure. Trace mitral valve regurgitation.  Tricuspid Valve: The tricuspid valve is normal in structure. Tricuspid valve regurgitation is trivial by color flow Doppler.  Aortic Valve: The aortic valve is tricuspid. Aortic valve regurgitation was not visualized by color flow Doppler.  Pulmonic Valve: The pulmonic valve was grossly normal. Pulmonic valve regurgitation is not visualized by color flow Doppler.  Aorta: The aortic root is normal in size and structure.  Venous: The inferior vena cava is normal in size with greater than 50% respiratory variability, suggesting right atrial pressure of 3 mmHg.  IAS/Shunts: The atrial septum is grossly normal.     LEFT VENTRICLE PLAX 2D LVIDd:         4.50 cm       Diastology LVIDs:         4.00 cm       LV e' lateral:   4.91 cm/s LV PW:         1.40 cm       LV E/e' lateral: 10.2 LV IVS:        1.30 cm       LV e' medial:    3.68 cm/s LVOT diam:     2.10 cm       LV E/e' medial:  13.6 LV SV:         22 ml LV SV Index:   11.62 LVOT Area:     3.46 cm   LV Volumes (MOD) LV area d, A2C:    26.10 cm LV area d, A4C:    30.30 cm LV area s, A2C:    18.90 cm LV area s, A4C:    19.40 cm LV major d, A2C:   8.40 cm LV major d, A4C:   7.37 cm LV major s, A2C:   7.43 cm LV major s, A4C:   6.20 cm LV vol d, MOD A2C: 65.0 ml LV vol d, MOD A4C: 102.0 ml LV vol s, MOD A2C: 40.7 ml LV vol s, MOD A4C: 52.4 ml LV SV MOD A2C:     24.3 ml LV SV MOD A4C:      102.0 ml LV SV MOD BP:      36.8 ml  RIGHT VENTRICLE             IVC RV S prime:     10.70 cm/s  IVC diam: 2.00 cm TAPSE (M-mode): 2.2 cm  LEFT ATRIUM             Index       RIGHT ATRIUM           Index LA diam:  3.30 cm 1.71 cm/m  RA Area:     14.70 cm LA Vol (A2C):   47.5 ml 24.65 ml/m RA Volume:   37.00 ml  19.20 ml/m LA Vol (A4C):   52.6 ml 27.30 ml/m LA Biplane Vol: 49.4 ml 25.64 ml/m  AORTIC VALVE LVOT Vmax:   84.20 cm/s LVOT Vmean:  62.200 cm/s LVOT VTI:    0.175 m   AORTA Ao Root diam: 3.40 cm  MITRAL VALVE                        TRICUSPID VALVE MV Area (PHT): 2.80 cm             TR Peak grad:   16.8 mmHg MV PHT:        78.59 msec           TR Vmax:        208.00 cm/s MV Decel Time: 271 msec MV E velocity: 50.10 cm/s 103 cm/s  SHUNTS MV A velocity: 93.40 cm/s 70.3 cm/s Systemic VTI:  0.18 m MV E/A ratio:  0.54       1.5       Systemic Diam: 2.10 cm    Oswaldo Milian MD Electronically signed by Oswaldo Milian MD Signature Date/Time: 02/13/2019/6:18:40 PM  Treatments:          Show:Clear all [x] Manual[x] Template[] Copied  Added by: [x] Atkins, Broadus Z, MD  [] Hover for details CARDIOTHORACIC SURGERY OPERATIVE NOTE  Date of Procedure:    02/14/2019  Preoperative Diagnosis:      Severe Coronary Artery Disease involving LAD with unstable angina unresponsive to medical management  Postoperative Diagnosis:    Same  Procedure:        Coronary Artery Bypass Grafting x 3             Left Internal Mammary Artery to Distal Left Anterior Descending Coronary Artery; Saphenous Vein Graft to Ramus Intermedius Coronary Artery; Sapheonous Vein Graft to  Diagonal Branch Coronary Artery, Endoscopic Vein Harvest from right Thigh  Surgeon:        B. 02/27/2019, MD  Assistant:       Murvin Natal PA-C  Anesthesia:    get  Operative Findings: ? Mildly depressed left ventricular systolic function ? good quality left internal  mammary artery conduit ? good quality saphenous vein conduit ? good quality target vessels for grafting    BRIEF CLINICAL NOTE AND INDICATIONS FOR SURGERY 74 yo man developed relatively sudden onset CP this week prompting ED evaluation. High sensitivity troponins were elevated and there was evidence for new CHF. LHC shows complex LAD disease best treated with CABG. He developed unremitting CP today; taken to OR urgently for CABG.          Discharge Exam: Blood pressure 129/80, pulse 66, temperature 97.7 F (36.5 C), temperature source Oral, resp. rate 13, height 5' 10"  (1.778 m), weight 74.3 kg, SpO2 97 %.   Cardiovascular: RRR Pulmonary: Clear to auscultation bilaterally Abdomen: Soft, non tender, bowel sounds present. Extremities: No lower extremity edema. Wounds: Clean and dry.  No erythema or signs of infection. Neurologic: right hand sensory intact but with weakness'palpable radial and ulnar pulses; RLE motor/sensory intact  Discharge Medications:   Allergies as of 02/19/2019      Reactions   Hydrocodone-acetaminophen Itching   Temazepam Itching   Trazodone And Nefazodone Itching      Medication List    STOP taking these medications   docusate sodium 100  MG capsule Commonly known as: Colace   meloxicam 15 MG tablet Commonly known as: MOBIC   senna 8.6 MG tablet Commonly known as: Senokot     TAKE these medications   amiodarone 200 MG tablet Commonly known as: PACERONE Take 1 tablet (200 mg total) by mouth daily.   aspirin EC 325 MG tablet Take 1 tablet (325 mg total) by mouth daily.   methocarbamol 500 MG tablet Commonly known as: ROBAXIN Take 1 tablet (500 mg total) by mouth every 6 (six) hours as needed (muscle spasm).   metoprolol tartrate 25 MG tablet Commonly known as: LOPRESSOR Take 1 tablet (25 mg total) by mouth 2 (two) times daily.   oxyCODONE 5 MG immediate release tablet Commonly known as: Oxy IR/ROXICODONE Take 1 tablet (5 mg total)  by mouth every 4 (four) hours as needed for severe pain. What changed:   how much to take  reasons to take this   rosuvastatin 40 MG tablet Commonly known as: CRESTOR Take 1 tablet (40 mg total) by mouth daily at 6 PM.   zolpidem 10 MG tablet Commonly known as: AMBIEN Take 10 mg by mouth at bedtime.            Durable Medical Equipment  (From admission, onward)         Start     Ordered   02/19/19 1425  For home use only DME 3 n 1  Once     02/19/19 1424   02/19/19 1424  For home use only DME Walker rolling  Once    Comments: Please provide rolling walker with 5" wheels  Question:  Patient needs a walker to treat with the following condition  Answer:  Physical deconditioning   02/19/19 1424        The patient has been discharged on:   1.Beta Blocker:  Yes [ x  ]                              No   [   ]                              If No, reason:  2.Ace Inhibitor/ARB: Yes [   ]                                     No  [ X   ]                                     If No, reason: Elevated creatinine, labile BP  3.Statin:   Yes Valu.Nieves   ]                  No  [   ]                  If No, reason:  4.Ecasa:  Yes  Valu.Nieves   ]                  No   [   ]                  If No, reason:   Follow-up Information    Kroeger, Krista M.,  PA-C. Go on 03/07/2019.   Specialty: Physician Assistant Why: You have a cardiology follow up appointment with Ms. Roby Lofts on Wednesday 03/07/19. Contact information: [place] [address] [phone]        [name], MD Follow up on 02/23/2019.   Specialty: Cardiothoracic Surgery Why: Yoiu have a follow up appointment with Dr. Orvan Seen on 10/23 at 12:30.  Please get CXR at [facility] 30 min prior to your appointment, which is located on first floor of our office building.  Contact information: [address] [place] [address] [phone]             Signed:  Arnoldo Lenis 02/19/2019, 2:35 PM

## 2019-02-16 NOTE — Plan of Care (Signed)

## 2019-02-16 NOTE — Progress Notes (Signed)
Progress Note  Patient Name: Roger [name] Date of Encounter: 02/16/2019  Primary Cardiologist: Shelva Majestic, MD   Subjective   Day 2 s/p urgent CABG for USAP on iv NTG/heparin;  No chest pain  Inpatient Medications    Scheduled Meds: . acetaminophen  1,000 mg Oral Q6H   Or  . acetaminophen (TYLENOL) oral liquid 160 mg/5 mL  1,000 mg Per Tube Q6H  . aspirin EC  325 mg Oral Daily  . bisacodyl  10 mg Oral Daily   Or  . bisacodyl  10 mg Rectal Daily  . Chlorhexidine Gluconate Cloth  6 each Topical Daily  . colchicine  0.6 mg Oral Daily  . docusate sodium  200 mg Oral Daily  . insulin aspart  0-24 Units Subcutaneous Q4H  . ketorolac  7.5 mg Intravenous Q6H  . lisinopril  5 mg Oral Daily  . mouth rinse  15 mL Mouth Rinse BID  . metoprolol tartrate  12.5 mg Oral BID   Or  . metoprolol tartrate  12.5 mg Per Tube BID  . mupirocin ointment  1 application Nasal BID  . pantoprazole  40 mg Oral Daily  . rosuvastatin  40 mg Oral q1800  . sodium chloride flush  3 mL Intravenous Once  . sodium chloride flush  3 mL Intravenous Q12H  . sodium chloride flush  3 mL Intravenous Q12H  . sodium chloride flush  3 mL Intravenous Q12H  . sodium chloride flush  3 mL Intravenous Q12H   Continuous Infusions: . sodium chloride Stopped (02/14/19 2216)  . sodium chloride    . sodium chloride    . sodium chloride    . sodium chloride 20 mL/hr at 02/14/19 1842  . cefUROXime (ZINACEF)  IV Stopped (02/15/19 2055)  . lactated ringers    . lactated ringers    . lactated ringers Stopped (02/15/19 1220)  . niCARDipine Stopped (02/14/19 2151)  . nitroGLYCERIN Stopped (02/14/19 1933)  . phenylephrine (NEO-SYNEPHRINE) Adult infusion Stopped (02/14/19 1825)  . sodium chloride     PRN Meds: sodium chloride, sodium chloride, sodium chloride, influenza vaccine adjuvanted, lactated ringers, methocarbamol, metoprolol tartrate, midazolam, morphine injection, ondansetron (ZOFRAN) IV, oxyCODONE, sodium  chloride flush, sodium chloride flush, sodium chloride flush, traMADol   Vital Signs    Vitals:   02/16/19 0400 02/16/19 0500 02/16/19 0600 02/16/19 0700  BP: 110/75 100/67 104/81 99/67  Pulse: (!) 104 (!) 103 (!) 109 (!) 114  Resp: 20 17 (!) 22 (!) 25  Temp:      TempSrc:      SpO2: 96% 94% 93% 97%  Weight:  79.9 kg    Height:        Intake/Output Summary (Last 24 hours) at 02/16/2019 0750 Last data filed at 02/16/2019 [id number] Gross per 24 hour  Intake 618.58 ml  Output 1985 ml  Net -1366.42 ml   I/O since admission: -481  Last 3 Weights 02/16/2019 02/15/2019 02/14/2019  Weight (lbs) 176 lb 2.4 oz 173 lb 8 oz 168 lb 14.4 oz  Weight (kg) 79.9 kg 78.7 kg 76.613 kg      Telemetry    Atrial Fibrillation rate now 115 - 135 - Personally Reviewed  ECG    02/16/2019: ECG (independently read by me): Atrial Fibrillation at 117  02/15/2019 ECG (independently read by me): Sinus at 100 with normalization of T wave abnormalities  02/14/2019 ECG (independently read by me): NSR at 83 with new anterolateral T wave inversion  Physical Exam  BP 99/67   Pulse (!) 114   Temp (!) 97.5 F (36.4 C) (Oral)   Resp (!) 25   Ht 5\' 10"  (1.778 m)   Wt 79.9 kg   SpO2 97%   BMI 25.27 kg/m  General: Alert, oriented, no distress.  Skin: normal turgor, no rashes, warm and dry HEENT: Normocephalic, atraumatic. Pupils equal round and reactive to light; sclera anicteric; extraocular muscles intact;  Nose without nasal septal hypertrophy Mouth/Parynx benign; Mallinpatti scale 3 Neck: No JVD, no carotid bruits; normal carotid upstroke Lungs: clear to ausculatation and percussion; no wheezing or rales Chest wall: decreased BS at bases Heart: PMI not displaced, RRR, s1 s2 normal, 1/6 systolic murmur, no diastolic murmur, no rubs, gallops, thrills, or heaves Abdomen: soft, nontender; no hepatosplenomehaly, BS+; abdominal aorta nontender and not dilated by palpation. Back: no CVA tenderness  Pulses 2+ Musculoskeletal: full range of motion, normal strength, no joint deformities Extremities: no clubbing cyanosis or edema, Homan's sign negative  Neurologic: grossly nonfocal; Cranial nerves grossly wnl Psychologic: Normal mood and affect   Labs    High Sensitivity Troponin:   Recent Labs  Lab 02/12/19 1259 02/12/19 1522 02/12/19 2116 02/12/19 2317 02/14/19 1143  TROPONINIHS 35* 75* 103* 97* 18*      Chemistry Recent Labs  Lab 02/15/19 0403 02/15/19 1729 02/16/19 0428  NA 132* 129* 128*  K 4.4 4.4 4.0  CL 102 97* 96*  CO2 22 23 22   GLUCOSE 104* 123* 117*  BUN 11 11 18   CREATININE 1.09 1.17 1.34*  CALCIUM 8.0* 8.3* 8.0*  GFRNONAA >60 >60 53*  GFRAA >60 >60 >60  ANIONGAP 8 9 10      Hematology Recent Labs  Lab 02/15/19 0403 02/15/19 1729 02/16/19 0428  WBC 8.7 10.3 10.2  RBC 2.94* 2.84* 2.80*  HGB 10.4* 10.3* 9.8*  HCT 30.8* 29.2* 29.6*  MCV 104.8* 102.8* 105.7*  MCH 35.4* 36.3* 35.0*  MCHC 33.8 35.3 33.1  RDW 12.8 12.8 12.9  PLT 120* 124* 125*    BNP Recent Labs  Lab 02/12/19 1259  BNP 59.6    Lipid Panel  No results found for: CHOL, TRIG, HDL, CHOLHDL, VLDL, LDLCALC, LDLDIRECT, LABVLDL  DDimer No results for input(s): DDIMER in the last 168 hours.   Radiology    Dg Chest Port 1 View  Result Date: 02/16/2019 CLINICAL DATA:  Sore chest.  Chest tube EXAM: PORTABLE CHEST 1 VIEW COMPARISON:  Yesterday FINDINGS: Swan-Ganz catheter has been removed. Chest tubes remain. No visible pneumothorax. Indistinct opacity at the lung bases with left pleural effusion. Stable cardiomegaly. CABG. IMPRESSION: 1. Stable remaining hardware.  No visible pneumothorax 2. Stable atelectasis and small left pleural effusion. Electronically Signed   By: Monte Fantasia M.D.   On: 02/16/2019 06:58   Dg Chest Port 1 View  Result Date: 02/15/2019 CLINICAL DATA:  Chest tube EXAM: PORTABLE CHEST 1 VIEW COMPARISON:  Yesterday FINDINGS: Tracheal and esophageal  extubation. Swan-Ganz catheter from right IJ approach with tip at the right main pulmonary artery. Thoracic drains in place. No convincing pneumothorax. Atelectasis at both lung bases that is unchanged. Negative for edema. IMPRESSION: 1. Stable remaining hardware. 2. Stable atelectasis.  No visible pneumothorax. Electronically Signed   By: Monte Fantasia M.D.   On: 02/15/2019 06:55   Dg Chest Port 1 View  Result Date: 02/14/2019 CLINICAL DATA:  Post CABG. EXAM: PORTABLE CHEST 1 VIEW COMPARISON:  02/12/2019 FINDINGS: Endotracheal tube is roughly 3.9 cm above the carina. Nasogastric tube extends into  the abdomen. Right jugular central line with the pulmonary catheter in the right pulmonary artery. Evidence for mediastinal and left chest tubes. Negative for pneumothorax. Densities at the left lung base are most compatible with atelectasis. Negative for a large pneumothorax. IMPRESSION: 1. Support apparatuses appear to be appropriately positioned. 2. Chest tubes without a pneumothorax. 3. Left basilar densities are most compatible with volume loss. Electronically Signed   By: Markus Daft M.D.   On: 02/14/2019 19:12   Vas US Doppler Pre Cabg  Result Date: 02/14/2019 PREOPERATIVE VASCULAR EVALUATION  Risk Factors:     Coronary artery disease. Comparison Study: No prior. Performing Technologist: Oda Cogan RDMS, RVT  Examination Guidelines: A complete evaluation includes B-mode imaging, spectral Doppler, color Doppler, and power Doppler as needed of all accessible portions of each vessel. Bilateral testing is considered an integral part of a complete examination. Limited examinations for reoccurring indications may be performed as noted.  Right Carotid Findings: +----------+--------+--------+--------+--------+------------------+           PSV cm/sEDV cm/sStenosisDescribeComments           +----------+--------+--------+--------+--------+------------------+ CCA Prox  115     16                                          +----------+--------+--------+--------+--------+------------------+ CCA Distal47      13                      intimal thickening +----------+--------+--------+--------+--------+------------------+ ICA Prox  42      16      1-39%           intimal thickening +----------+--------+--------+--------+--------+------------------+ ICA Distal65      22                                         +----------+--------+--------+--------+--------+------------------+ ECA       88      14                                         +----------+--------+--------+--------+--------+------------------+ Portions of this table do not appear on this page. +----------+--------+-------+----------------+------------+           PSV cm/sEDV cmsDescribe        Arm Pressure +----------+--------+-------+----------------+------------+ Subclavian88             Multiphasic, WNL             +----------+--------+-------+----------------+------------+ +---------+--------+--+--------+--+---------+ VertebralPSV cm/s43EDV cm/s18Antegrade +---------+--------+--+--------+--+---------+ Left Carotid Findings: +----------+--------+--------+--------+--------+------------------+           PSV cm/sEDV cm/sStenosisDescribeComments           +----------+--------+--------+--------+--------+------------------+ CCA Prox  63      15                                         +----------+--------+--------+--------+--------+------------------+ CCA Distal56      16                      intimal thickening +----------+--------+--------+--------+--------+------------------+ ICA Prox  57      21      1-39%  calcific                   +----------+--------+--------+--------+--------+------------------+ ICA Distal55      24                                         +----------+--------+--------+--------+--------+------------------+ ECA       74      12                                          +----------+--------+--------+--------+--------+------------------+ +----------+--------+--------+----------------+------------+ SubclavianPSV cm/sEDV cm/sDescribe        Arm Pressure +----------+--------+--------+----------------+------------+           86              Multiphasic, WNL             +----------+--------+--------+----------------+------------+ +---------+--------+--+--------+--+---------+ VertebralPSV cm/s38EDV cm/s16Antegrade +---------+--------+--+--------+--+---------+  Summary: Right Carotid: Velocities in the right ICA are consistent with a 1-39% stenosis. Left Carotid: Velocities in the left ICA are consistent with a 1-39% stenosis. Vertebrals: Bilateral vertebral arteries demonstrate antegrade flow.  Electronically signed by Curt Jews MD on 02/14/2019 at 3:06:08 PM.    Final     Cardiac Studies   02/13/19 cardiac cath  There is mild to moderate left ventricular systolic dysfunction.  LV end diastolic pressure is normal.  The left ventricular ejection fraction is ~40-45% by visual estimate. Anterior hypokinesis.  ----------------------------------------------  Ost LAD to Prox LAD lesion is 80% stenosed.  Prox LAD to Mid LAD lesion is 70% stenosed with 50% stenosed side branch in 1st Diag.  Ramus lesion is 30% stenosed.  Otherwise normal coronaries   SUMMARY  Severe ostial and proximal LAD with extensive disease segment.  Otherwise normal ramus intermedius and dominant circumflex.  Excellent LAD and diagonal targets  Mildly reduced EF with anterior hypokinesis.  Normal LVEDP  RECOMMENDATIONS  Return to nursing unit, restart IV heparin 8 hours after sheath removal.  Discussed images with Dr. Claiborne Billings, who agrees that prior to considering relatively high risk ostial LAD with very long lesion to cover with stents, the best option will be to consider CVTS consultation with PCI as a backup option.  Continue aggressive restart  medication.  Diagnostic Dominance: Left   Echo 02/13/19 IMPRESSIONS   1. Left ventricular ejection fraction, by visual estimation, is 40 to 45%. The left ventricle has mild to moderately decreased function. Normal left ventricular size. There is mildly increased left ventricular hypertrophy.  2. Left ventricular diastolic Doppler parameters are consistent with impaired relaxation pattern of LV diastolic filling.  3. Global right ventricle has normal systolic function.The right ventricular size is normal. No increase in right ventricular wall thickness.  4. Left atrial size was normal.  5. Right atrial size was normal.  6. The mitral valve is normal in structure. Trace mitral valve regurgitation.  7. The tricuspid valve is normal in structure. Tricuspid valve regurgitation is trivial.  8. The aortic valve is tricuspid Aortic valve regurgitation was not visualized by color flow Doppler.  9. Normal pulmonary artery systolic pressure. 10. The inferior vena cava is normal in size with greater than 50% respiratory variability, suggesting right atrial pressure of 3 mmHg.  FINDINGS  Left Ventricle: Left ventricular ejection fraction, by visual estimation, is 40 to 45%. The left ventricle  has mild to moderately decreased function. There is mildly increased left ventricular hypertrophy. Concentric left ventricular hypertrophy. Normal  left ventricular size. Spectral Doppler shows Left ventricular diastolic Doppler parameters are consistent with impaired relaxation pattern of LV diastolic filling. Normal left ventricular filling pressures.  Right Ventricle: The right ventricular size is normal. No increase in right ventricular wall thickness. Global RV systolic function is has normal systolic function. The tricuspid regurgitant velocity is 2.05 m/s, and with an assumed right atrial pressure  of 3 mmHg, the estimated right ventricular systolic pressure is normal at 19.8 mmHg.  Left Atrium: Left  atrial size was normal in size.  Right Atrium: Right atrial size was normal in size  Pericardium: There is no evidence of pericardial effusion.  Mitral Valve: The mitral valve is normal in structure. Trace mitral valve regurgitation.  Tricuspid Valve: The tricuspid valve is normal in structure. Tricuspid valve regurgitation is trivial by color flow Doppler.  Aortic Valve: The aortic valve is tricuspid. Aortic valve regurgitation was not visualized by color flow Doppler.  Pulmonic Valve: The pulmonic valve was grossly normal. Pulmonic valve regurgitation is not visualized by color flow Doppler.  Aorta: The aortic root is normal in size and structure.  Venous: The inferior vena cava is normal in size with greater than 50% respiratory variability, suggesting right atrial pressure of 3 mmHg.  IAS/Shunts: The atrial septum is grossly normal.   Patient Profile     73 y.o. male with a PMH of borderline HTN, HLD, and chronic back pain who is being seen today for the evaluation of chest pain and SOB, cath on the 13th with ostial LAD lesion await TCTS consult.  Assessment & Plan    1. Unstable angina/ day 2 s/p urgent CABG: Appreciate care of Dr. Orvan Seen with LIMA to LAD, SVG - dx and SVR to ramus.  No recurrent chest pain;  Went into AF in middle of night  2. Mild LV dysfunction;  Echo day of cath 40 - 45%; post op TEE 35 - 40%;  Suspect stunning rather than infarction.  ECG normalized today;  Suspect there will be recovery of LV function. Now on Lisinopril 5mg , may need to decrease dose to 2.5 if BP low; on metoprolol 12.5 mg bid.  3. Post-op AF: AF rate now 115 - 130; BP ~98-100.  Will initiate amiodarone 150 mg bolus then 60 mg/h for 6 hrs, then 30 mg/h.  K 4.0; Mg 2.3.   4. Hyperlipidemia: on rosuvastatin 40 mg;  Will check labs in 4 weeks   4. Essential HTN: Stable but soft; will decrease lisinopril to 2.5 mg.  5. Anemia: no bleeding post op; H/H 10.4/30.8 > 9.8/29.6   6. Macrocytosis: MCV increased to 105.7; will check B12 and folate  7. Mild renal insufficiency: Cr increased today to 1.34  7. Post op CXR: stable atelectasis; small left effusion today  For questions or updates, please contact [place] Please consult [email or web address] for contact info under    Signed, Shelva Majestic, MD  02/16/2019, 7:50 AM

## 2019-02-16 NOTE — Discharge Instructions (Signed)

## 2019-02-16 NOTE — Progress Notes (Signed)
2 Days Post-Op Procedure(s) (LRB): CORONARY ARTERY BYPASS GRAFTING (CABG) TIMES THREE USING LEFT INTERNAL MAMMARY ARTERY AND ENDOSCOPIC HARVESTING OF RIGHT GREATER SAPHENOUS VEIN (N/A) TRANSESOPHAGEAL ECHOCARDIOGRAM (TEE) (N/A) Subjective: No complaints  Objective: Vital signs in last 24 hours: Temp:  [97.5 F (36.4 C)-98.2 F (36.8 C)] 97.5 F (36.4 C) (10/16 0349) Pulse Rate:  [48-114] 114 (10/16 0700) Cardiac Rhythm: Normal sinus rhythm (10/15 2000) Resp:  [12-25] 25 (10/16 0700) BP: (93-124)/(59-82) 99/67 (10/16 0700) SpO2:  [89 %-100 %] 97 % (10/16 0700) Weight:  [79.9 kg] 79.9 kg (10/16 0500)  Hemodynamic parameters for last 24 hours:    Intake/Output from previous day: 10/15 0701 - 10/16 0700 In: 618.6 [P.O.:360; I.V.:53.3; IV Piggyback:205.3] Out: 1985 [Urine:1555; Chest Tube:430] Intake/Output this shift: No intake/output data recorded.  General appearance: alert and cooperative Neurologic: intact Heart: irregularly irregular rhythm Lungs: bronchophony  ERROR--diminished BS at both bases Extremities: edema 1+ Wound: dressed/dry  Lab Results: Recent Labs    02/15/19 1729 02/16/19 0428  WBC 10.3 10.2  HGB 10.3* 9.8*  HCT 29.2* 29.6*  PLT 124* 125*   BMET:  Recent Labs    02/15/19 1729 02/16/19 0428  NA 129* 128*  K 4.4 4.0  CL 97* 96*  CO2 23 22  GLUCOSE 123* 117*  BUN 11 18  CREATININE 1.17 1.34*  CALCIUM 8.3* 8.0*    PT/INR:  Recent Labs    02/14/19 1841  LABPROT 15.9*  INR 1.3*   ABG    Component Value Date/Time   PHART 7.375 02/15/2019 0207   HCO3 23.1 02/15/2019 0207   TCO2 24 02/15/2019 0207   ACIDBASEDEF 2.0 02/15/2019 0207   O2SAT 98.0 02/15/2019 0207   CBG (last 3)  Recent Labs    02/15/19 2331 02/16/19 0345 02/16/19 0737  GLUCAP 132* 113* 131*    Assessment/Plan: S/P Procedure(s) (LRB): CORONARY ARTERY BYPASS GRAFTING (CABG) TIMES THREE USING LEFT INTERNAL MAMMARY ARTERY AND ENDOSCOPIC HARVESTING OF RIGHT GREATER  SAPHENOUS VEIN (N/A) TRANSESOPHAGEAL ECHOCARDIOGRAM (TEE) (N/A) Mobilize Diuresis amiodarone for afib   LOS: 4 days    Roger [name] 02/16/2019

## 2019-02-17 LAB — CBC
HCT: 27.7 % — ABNORMAL LOW (ref 39.0–52.0)
Hemoglobin: 9.6 g/dL — ABNORMAL LOW (ref 13.0–17.0)
MCH: 35.4 pg — ABNORMAL HIGH (ref 26.0–34.0)
MCHC: 34.7 g/dL (ref 30.0–36.0)
MCV: 102.2 fL — ABNORMAL HIGH (ref 80.0–100.0)
Platelets: 138 10*3/uL — ABNORMAL LOW (ref 150–400)
RBC: 2.71 MIL/uL — ABNORMAL LOW (ref 4.22–5.81)
RDW: 12.7 % (ref 11.5–15.5)
WBC: 9.5 10*3/uL (ref 4.0–10.5)
nRBC: 0 % (ref 0.0–0.2)

## 2019-02-17 LAB — GLUCOSE, CAPILLARY
Glucose-Capillary: 96 mg/dL (ref 70–99)
Glucose-Capillary: 108 mg/dL — ABNORMAL HIGH (ref 70–99)
Glucose-Capillary: 130 mg/dL — ABNORMAL HIGH (ref 70–99)
Glucose-Capillary: 106 mg/dL — ABNORMAL HIGH (ref 70–99)
Glucose-Capillary: 121 mg/dL — ABNORMAL HIGH (ref 70–99)

## 2019-02-17 MED ORDER — AMIODARONE HCL 200 MG PO TABS
200.0000 mg | ORAL_TABLET | Freq: Two times a day (BID) | ORAL | Status: DC
  Administered 2019-02-17 – 2019-02-19 (×5): 200 mg via ORAL
  Filled 2019-02-17 (×5): qty 1

## 2019-02-17 MED ORDER — METOPROLOL TARTRATE 25 MG/10 ML ORAL SUSPENSION: 12.5000 mg | Freq: Three times a day (TID) | ORAL | Status: DC

## 2019-02-17 MED ORDER — METOPROLOL TARTRATE 25 MG/10 ML ORAL SUSPENSION
25.0000 mg | Freq: Three times a day (TID) | ORAL | Status: DC
  Filled 2019-02-17 (×7): qty 10

## 2019-02-17 MED ORDER — FUROSEMIDE 40 MG PO TABS
40.0000 mg | ORAL_TABLET | Freq: Every day | ORAL | Status: DC
  Administered 2019-02-17: 40 mg via ORAL
  Filled 2019-02-17: qty 1

## 2019-02-17 MED ORDER — AMIODARONE LOAD VIA INFUSION
150.0000 mg | Freq: Once | INTRAVENOUS | Status: AC
  Administered 2019-02-17: 150 mg via INTRAVENOUS
  Filled 2019-02-17: qty 83.34

## 2019-02-17 MED ORDER — POTASSIUM CHLORIDE CRYS ER 20 MEQ PO TBCR
20.0000 meq | EXTENDED_RELEASE_TABLET | Freq: Two times a day (BID) | ORAL | Status: DC
  Administered 2019-02-17 (×2): 20 meq via ORAL
  Filled 2019-02-17 (×2): qty 1

## 2019-02-17 MED ORDER — METOPROLOL TARTRATE 25 MG PO TABS
25.0000 mg | ORAL_TABLET | Freq: Three times a day (TID) | ORAL | Status: DC
  Administered 2019-02-17 – 2019-02-18 (×6): 25 mg via ORAL
  Filled 2019-02-17 (×7): qty 1

## 2019-02-17 MED ORDER — INSULIN ASPART 100 UNIT/ML ~~LOC~~ SOLN: 0.0000 [IU] | Freq: Three times a day (TID) | SUBCUTANEOUS | Status: DC

## 2019-02-17 MED ORDER — METOPROLOL TARTRATE 25 MG PO TABS: 25.0000 mg | ORAL_TABLET | Freq: Three times a day (TID) | ORAL | Status: DC

## 2019-02-17 NOTE — Progress Notes (Signed)
3 Days Post-Op Procedure(s) (LRB): CORONARY ARTERY BYPASS GRAFTING (CABG) TIMES THREE USING LEFT INTERNAL MAMMARY ARTERY AND ENDOSCOPIC HARVESTING OF RIGHT GREATER SAPHENOUS VEIN (N/A) TRANSESOPHAGEAL ECHOCARDIOGRAM (TEE) (N/A) Subjective: Feeling better in/out of afib Objective: Vital signs in last 24 hours: Temp:  [97.7 F (36.5 C)-98.2 F (36.8 C)] 97.8 F (36.6 C) (10/17 0320) Pulse Rate:  [70-86] 74 (10/17 0700) Cardiac Rhythm: Normal sinus rhythm (10/17 0400) Resp:  [10-28] 21 (10/17 0700) BP: (87-118)/(49-92) 115/77 (10/17 0700) SpO2:  [93 %-99 %] 97 % (10/17 0700) Weight:  [84.1 kg] 84.1 kg (10/17 0500)  Hemodynamic parameters for last 24 hours:    Intake/Output from previous day: 10/16 0701 - 10/17 0700 In: 1118.6 [P.O.:400; I.V.:618.6; IV Piggyback:100] Out: [id number] [Urine:4525; Chest Tube:150] Intake/Output this shift: No intake/output data recorded.       Exam    General- alert and comfortable    Neck- no JVD, no cervical adenopathy palpable, no carotid bruit   Lungs- clear without rales, wheezes   Cor- regular rate and rhythm, no murmur , gallop   Abdomen- soft, non-tender   Extremities - warm, non-tender, minimal edema   Neuro- oriented, appropriate, no focal weakness   Lab Results: Recent Labs    02/16/19 0428 02/17/19 0253  WBC 10.2 9.5  HGB 9.8* 9.6*  HCT 29.6* 27.7*  PLT 125* 138*   BMET:  Recent Labs    02/15/19 1729 02/16/19 0428  NA 129* 128*  K 4.4 4.0  CL 97* 96*  CO2 23 22  GLUCOSE 123* 117*  BUN 11 18  CREATININE 1.17 1.34*  CALCIUM 8.3* 8.0*    PT/INR:  Recent Labs    02/14/19 1841  LABPROT 15.9*  INR 1.3*   ABG    Component Value Date/Time   PHART 7.375 02/15/2019 0207   HCO3 23.1 02/15/2019 0207   TCO2 24 02/15/2019 0207   ACIDBASEDEF 2.0 02/15/2019 0207   O2SAT 98.0 02/15/2019 0207   CBG (last 3)  Recent Labs    02/16/19 2010 02/16/19 2348 02/17/19 0318  GLUCAP 128* 95 96    Assessment/Plan: S/P  Procedure(s) (LRB): CORONARY ARTERY BYPASS GRAFTING (CABG) TIMES THREE USING LEFT INTERNAL MAMMARY ARTERY AND ENDOSCOPIC HARVESTING OF RIGHT GREATER SAPHENOUS VEIN (N/A) TRANSESOPHAGEAL ECHOCARDIOGRAM (TEE) (N/A) Mobilize Diuresis See progression orders cont iv amiodarone  LOS: 5 days    Roger [name] 02/17/2019

## 2019-02-17 NOTE — Progress Notes (Signed)
02/17/2019 1800 Received pt to room 4E-16 from [place].  Pt is A&O, no C/O voiced.  Tele monitor applied and CCMD notified.  CHG bath given.  Oriented to room, call light and bed.  Call bell in reach. [place]

## 2019-02-17 NOTE — Progress Notes (Signed)
Progress Note   Subjective   Doing well today, the patient denies CP or SOB.  No new concerns  Inpatient Medications    Scheduled Meds: . acetaminophen  1,000 mg Oral Q6H   Or  . acetaminophen (TYLENOL) oral liquid 160 mg/5 mL  1,000 mg Per Tube Q6H  . aspirin EC  325 mg Oral Daily  . bisacodyl  10 mg Oral Daily   Or  . bisacodyl  10 mg Rectal Daily  . Chlorhexidine Gluconate Cloth  6 each Topical Daily  . colchicine  0.6 mg Oral Daily  . docusate sodium  200 mg Oral Daily  . furosemide  40 mg Intravenous BID  . insulin aspart  0-24 Units Subcutaneous Q4H  . mouth rinse  15 mL Mouth Rinse BID  . metoprolol tartrate  12.5 mg Oral TID   Or  . metoprolol tartrate  12.5 mg Per Tube TID  . mupirocin ointment  1 application Nasal BID  . pantoprazole  40 mg Oral Daily  . rosuvastatin  40 mg Oral q1800  . sodium chloride flush  3 mL Intravenous Once  . sodium chloride flush  3 mL Intravenous Q12H  . sodium chloride flush  3 mL Intravenous Q12H  . sodium chloride flush  3 mL Intravenous Q12H  . sodium chloride flush  3 mL Intravenous Q12H   Continuous Infusions: . sodium chloride Stopped (02/14/19 2216)  . sodium chloride    . sodium chloride    . sodium chloride    . sodium chloride 20 mL/hr at 02/14/19 1842  . amiodarone 30 mg/hr (02/16/19 1415)  . lactated ringers    . lactated ringers    . lactated ringers Stopped (02/15/19 1220)  . niCARDipine Stopped (02/14/19 2151)  . nitroGLYCERIN Stopped (02/14/19 1933)  . phenylephrine (NEO-SYNEPHRINE) Adult infusion Stopped (02/14/19 1825)  . sodium chloride     PRN Meds: sodium chloride, sodium chloride, sodium chloride, influenza vaccine adjuvanted, lactated ringers, methocarbamol, metoprolol tartrate, midazolam, morphine injection, ondansetron (ZOFRAN) IV, oxyCODONE, sodium chloride flush, sodium chloride flush, sodium chloride flush, traMADol   Vital Signs    Vitals:   02/17/19 0400 02/17/19 0500 02/17/19 0600  02/17/19 0700  BP: 104/64 110/63 105/71 115/77  Pulse: 72 71 70 74  Resp: (!) 28 (!) 25 (!) 21 (!) 21  Temp:      TempSrc:      SpO2: 95% 95% 95% 97%  Weight:  84.1 kg    Height:        Intake/Output Summary (Last 24 hours) at 02/17/2019 0813 Last data filed at 02/17/2019 0300 Gross per 24 hour  Intake 1118.61 ml  Output 4675 ml  Net -3556.39 ml   Filed Weights   02/15/19 0500 02/16/19 0500 02/17/19 0500  Weight: 78.7 kg 79.9 kg 84.1 kg    Telemetry    afib has converted to sinus rhythm.  Occasional PACs - Personally Reviewed  Physical Exam   GEN- The patient is well appearing, alert and oriented x 3 today.  Up to chair Head- normocephalic, atraumatic Eyes-  Sclera clear, conjunctiva pink Ears- hearing intact Oropharynx- clear Neck- supple,  R CVL in place Lungs-  normal work of breathing Heart- Regular rate and rhythm  MS- no significant deformity or atrophy Skin- incision looks stable Psych- euthymic mood, full affect Neuro- strength and sensation are intact   Labs    Chemistry Recent Labs  Lab 02/15/19 0403 02/15/19 1729 02/16/19 0428  NA 132* 129* 128*  K 4.4 4.4 4.0  CL 102 97* 96*  CO2 22 23 22   GLUCOSE 104* 123* 117*  BUN 11 11 18   CREATININE 1.09 1.17 1.34*  CALCIUM 8.0* 8.3* 8.0*  GFRNONAA >60 >60 53*  GFRAA >60 >60 >60  ANIONGAP 8 9 10      Hematology Recent Labs  Lab 02/15/19 1729 02/16/19 0428 02/17/19 0253  WBC 10.3 10.2 9.5  RBC 2.84* 2.80* 2.71*  HGB 10.3* 9.8* 9.6*  HCT 29.2* 29.6* 27.7*  MCV 102.8* 105.7* 102.2*  MCH 36.3* 35.0* 35.4*  MCHC 35.3 33.1 34.7  RDW 12.8 12.9 12.7  PLT 124* 125* 138*      Patient Profile     73 y.o. male with a PMH of borderline HTN, HLD, and chronic back painwho is being seen today for the evaluation of chest pain and SOB, cath on the 13th with ostial LAD lesion await TCTS consult.   Assessment & Plan    1.  Post operative afib Now back in sinus Continue amiodarone at current rate  today.  Convert to PO tomorrow. I think we can avoid anticoagulation for the moment  2. CAD S/p CABG Continue medicine optimization as able post operatively   Thompson Grayer MD, [facility] 02/17/2019 8:13 AM

## 2019-02-17 NOTE — Plan of Care (Signed)
Poc progressing.  

## 2019-02-18 ENCOUNTER — Inpatient Hospital Stay (HOSPITAL_COMMUNITY): Payer: Medicare Other

## 2019-02-18 LAB — COMPREHENSIVE METABOLIC PANEL
ALT: 15 U/L (ref 0–44)
AST: 20 U/L (ref 15–41)
Albumin: 2.7 g/dL — ABNORMAL LOW (ref 3.5–5.0)
Alkaline Phosphatase: 94 U/L (ref 38–126)
Anion gap: 10 (ref 5–15)
BUN: 17 mg/dL (ref 8–23)
CO2: 27 mmol/L (ref 22–32)
Calcium: 8.7 mg/dL — ABNORMAL LOW (ref 8.9–10.3)
Chloride: 100 mmol/L (ref 98–111)
Creatinine, Ser: 1.37 mg/dL — ABNORMAL HIGH (ref 0.61–1.24)
GFR calc Af Amer: >60 mL/min (ref >60)
GFR calc non Af Amer: 52 mL/min — ABNORMAL LOW (ref >60)
Glucose, Bld: 100 mg/dL — ABNORMAL HIGH (ref 70–99)
Potassium: 3.9 mmol/L (ref 3.5–5.1)
Sodium: 137 mmol/L (ref 135–145)
Total Bilirubin: 0.9 mg/dL (ref 0.3–1.2)
Total Protein: 6.2 g/dL — ABNORMAL LOW (ref 6.5–8.1)

## 2019-02-18 LAB — CBC
HCT: 28.9 % — ABNORMAL LOW (ref 39.0–52.0)
Hemoglobin: 10 g/dL — ABNORMAL LOW (ref 13.0–17.0)
MCH: 35.6 pg — ABNORMAL HIGH (ref 26.0–34.0)
MCHC: 34.6 g/dL (ref 30.0–36.0)
MCV: 102.8 fL — ABNORMAL HIGH (ref 80.0–100.0)
Platelets: 199 10*3/uL (ref 150–400)
RBC: 2.81 MIL/uL — ABNORMAL LOW (ref 4.22–5.81)
RDW: 12.6 % (ref 11.5–15.5)
WBC: 8.9 10*3/uL (ref 4.0–10.5)
nRBC: 0 % (ref 0.0–0.2)

## 2019-02-18 LAB — GLUCOSE, CAPILLARY: Glucose-Capillary: 92 mg/dL (ref 70–99)

## 2019-02-18 NOTE — Progress Notes (Signed)
Patient education completed for removal of pacing wires, patient verbalized understanding, pacing wires removed without difficulty, patient now on bedrest will continue to monitor.

## 2019-02-18 NOTE — Progress Notes (Addendum)
      [address]       [place] [address]             [phone]      4 Days Post-Op Procedure(s) (LRB): CORONARY ARTERY BYPASS GRAFTING (CABG) TIMES THREE USING LEFT INTERNAL MAMMARY ARTERY AND ENDOSCOPIC HARVESTING OF RIGHT GREATER SAPHENOUS VEIN (N/A) TRANSESOPHAGEAL ECHOCARDIOGRAM (TEE) (N/A)   Subjective:  No new complaints.  States his pain and shortness of breath have improved.  Continues to be nauseated at times.  + ambulation with walker  + BM  Objective: Vital signs in last 24 hours: Temp:  [97.7 F (36.5 C)-98.6 F (37 C)] 97.7 F (36.5 C) (10/18 0521) Pulse Rate:  [71-80] 80 (10/18 0735) Cardiac Rhythm: Normal sinus rhythm (10/18 0340) Resp:  [16-21] 18 (10/18 0735) BP: (105-129)/(49-103) 127/78 (10/18 0735) SpO2:  [96 %-98 %] 97 % (10/18 0735) Weight:  [75.6 kg] 75.6 kg (10/18 0521)  Intake/Output from previous day: 10/17 0701 - 10/18 0700 In: 500 [P.O.:400; I.V.:100] Out: 1950 [Urine:1950]  General appearance: alert, cooperative and no distress Heart: regular rate and rhythm Lungs: clear to auscultation bilaterally Abdomen: soft, non-tender; bowel sounds normal; no masses,  no organomegaly Extremities: edema trace Wound: clean and dry  Lab Results: Recent Labs    02/17/19 0253 02/18/19 0439  WBC 9.5 8.9  HGB 9.6* 10.0*  HCT 27.7* 28.9*  PLT 138* 199   BMET:  Recent Labs    02/16/19 0428 02/18/19 0439  NA 128* 137  K 4.0 3.9  CL 96* 100  CO2 22 27  GLUCOSE 117* 100*  BUN 18 17  CREATININE 1.34* 1.37*  CALCIUM 8.0* 8.7*    PT/INR: No results for input(s): LABPROT, INR in the last 72 hours. ABG    Component Value Date/Time   PHART 7.375 02/15/2019 0207   HCO3 23.1 02/15/2019 0207   TCO2 24 02/15/2019 0207   ACIDBASEDEF 2.0 02/15/2019 0207   O2SAT 98.0 02/15/2019 0207   CBG (last 3)  Recent Labs    02/17/19 1612 02/17/19 2004 02/18/19 0646  GLUCAP 106* 121* 92    Assessment/Plan: S/P Procedure(s) (LRB):  CORONARY ARTERY BYPASS GRAFTING (CABG) TIMES THREE USING LEFT INTERNAL MAMMARY ARTERY AND ENDOSCOPIC HARVESTING OF RIGHT GREATER SAPHENOUS VEIN (N/A) TRANSESOPHAGEAL ECHOCARDIOGRAM (TEE) (N/A)  1. CV- PAF, maintaining NSR- continue Lopressor, oral Amiodarone, will d/c EPW today 2. Pulm- off oxygen, CXR shows no pneumothorax post chest tube removal, bilateral atelectasis, continue IS 3. Renal- creatinine stable at 1.37 weight is at baseline, no edema on exam, will stop lasix, potassium 4. Expected post operative blood loss anemia, Hgb stable at 10.0 5. CBGs controlled- not a diabetic, will d/c SSIP 6. Dispo- patient stable, PAF, currently maintaining NSR on Lopressor, Amiodarone, weight is at baseline, creatinine at 1.37, will stop Lasix, potassium, continue ambulation, likely ready for d/c in next 24-48 hours  LOS: 6 days    Erin Barrett 02/18/2019  maintaining NSR Prepare for DC tomorrow  patient examined and medical record reviewed,agree with above note. Tharon Aquas Trigt III 02/18/2019

## 2019-02-19 ENCOUNTER — Inpatient Hospital Stay (HOSPITAL_COMMUNITY): Payer: Medicare Other

## 2019-02-19 DIAGNOSIS — I2510 Atherosclerotic heart disease of native coronary artery without angina pectoris: Secondary | ICD-10-CM

## 2019-02-19 DIAGNOSIS — I4891 Unspecified atrial fibrillation: Secondary | ICD-10-CM

## 2019-02-19 LAB — CBC
HCT: 28.4 % — ABNORMAL LOW (ref 39.0–52.0)
Hemoglobin: 9.6 g/dL — ABNORMAL LOW (ref 13.0–17.0)
MCH: 34.5 pg — ABNORMAL HIGH (ref 26.0–34.0)
MCHC: 33.8 g/dL (ref 30.0–36.0)
MCV: 102.2 fL — ABNORMAL HIGH (ref 80.0–100.0)
Platelets: 244 10*3/uL (ref 150–400)
RBC: 2.78 MIL/uL — ABNORMAL LOW (ref 4.22–5.81)
RDW: 12.4 % (ref 11.5–15.5)
WBC: 7.9 10*3/uL (ref 4.0–10.5)
nRBC: 0 % (ref 0.0–0.2)

## 2019-02-19 LAB — BASIC METABOLIC PANEL
Anion gap: 10 (ref 5–15)
BUN: 16 mg/dL (ref 8–23)
CO2: 24 mmol/L (ref 22–32)
Calcium: 8.8 mg/dL — ABNORMAL LOW (ref 8.9–10.3)
Chloride: 103 mmol/L (ref 98–111)
Creatinine, Ser: 1.23 mg/dL (ref 0.61–1.24)
GFR calc Af Amer: >60 mL/min (ref >60)
GFR calc non Af Amer: 59 mL/min — ABNORMAL LOW (ref >60)
Glucose, Bld: 108 mg/dL — ABNORMAL HIGH (ref 70–99)
Potassium: 3.8 mmol/L (ref 3.5–5.1)
Sodium: 137 mmol/L (ref 135–145)

## 2019-02-19 MED ORDER — OXYCODONE HCL 5 MG PO TABS: 5.0000 mg | ORAL_TABLET | ORAL | 0 refills | Status: DC | PRN

## 2019-02-19 MED ORDER — POTASSIUM CHLORIDE CRYS ER 20 MEQ PO TBCR
30.0000 meq | EXTENDED_RELEASE_TABLET | Freq: Once | ORAL | Status: AC
  Administered 2019-02-19: 30 meq via ORAL
  Filled 2019-02-19: qty 1

## 2019-02-19 MED ORDER — METOPROLOL TARTRATE 25 MG/10 ML ORAL SUSPENSION
25.0000 mg | Freq: Two times a day (BID) | ORAL | Status: DC
  Filled 2019-02-19: qty 10

## 2019-02-19 MED ORDER — METOPROLOL TARTRATE 25 MG PO TABS: 25.0000 mg | ORAL_TABLET | Freq: Two times a day (BID) | ORAL | Status: DC

## 2019-02-19 MED ORDER — METOPROLOL TARTRATE 25 MG PO TABS: 25.0000 mg | ORAL_TABLET | Freq: Two times a day (BID) | ORAL | 1 refills | Status: DC

## 2019-02-19 MED ORDER — ASPIRIN EC 325 MG PO TBEC: 325.0000 mg | DELAYED_RELEASE_TABLET | Freq: Every day | ORAL | 0 refills | Status: DC

## 2019-02-19 MED ORDER — AMIODARONE HCL 200 MG PO TABS: 200.0000 mg | ORAL_TABLET | Freq: Every day | ORAL | 1 refills | Status: DC

## 2019-02-19 MED ORDER — ROSUVASTATIN CALCIUM 40 MG PO TABS: 40.0000 mg | ORAL_TABLET | Freq: Every day | ORAL | 1 refills | Status: DC

## 2019-02-19 NOTE — Evaluation (Signed)
Physical Therapy Evaluation Patient Details Name: Roger [name] MRN: [id number] DOB: [birth year] Today's Date: 02/19/2019   History of Present Illness  73 y.o. male, with no significant past medical history, presenting today with chest pain of one-week duration worsening in the last 2 days, associated shortness of breath, nausea but no vomiting and cough. PT now s/p CABG x3 with post-op a-fib and dizziness.  Clinical Impression  Pt demonstrates deficits in endurance, power, gait, functional mobility, and balance. Pt limited due to reports of dizziness during session, although BP, HR, and RR remain stable during activity. Pt taking multiple brief standing rest breaks due to reported dizziness. Pt is able to perform bed mobility, transfer, and ambulate with use of RW and without physical assistance at this time. Pt will benefit from home health PT initially, transitioning to outpatient PT. Pt will also benefit from RW and a 3in1 commode at discharge.    Follow Up Recommendations Home health PT(HHPT transitioining to OP cardiac rehab)    Equipment Recommendations  Rolling walker with 5" wheels;3in1 (PT)    Recommendations for Other Services       Precautions / Restrictions Precautions Precautions: Fall;Sternal Precaution Booklet Issued: No Restrictions Weight Bearing Restrictions: No      Mobility  Bed Mobility Overal bed mobility: Needs Assistance Bed Mobility: Rolling;Sidelying to Sit;Sit to Sidelying Rolling: Independent Sidelying to sit: Supervision     Sit to sidelying: Supervision General bed mobility comments: PT providing verbal cues to maintain sternal precautions  Transfers Overall transfer level: Needs assistance Equipment used: Rolling walker (2 wheeled) Transfers: Sit to/from Stand Sit to Stand: Supervision            Ambulation/Gait Ambulation/Gait assistance: Supervision Gait Distance (Feet): 100 Feet Assistive device: Rolling walker (2  wheeled) Gait Pattern/deviations: Step-through pattern Gait velocity: reduced Gait velocity interpretation: 1.31 - 2.62 ft/sec, indicative of limited community ambulator General Gait Details: pt with steady step through gait with slowed gait speed  Stairs            Wheelchair Mobility    Modified Rankin (Stroke Patients Only)       Balance Overall balance assessment: Needs assistance Sitting-balance support: Feet supported Sitting balance-Leahy Scale: Good     Standing balance support: Bilateral upper extremity supported Standing balance-Leahy Scale: Fair Standing balance comment: supervision with BUE support of RW                             Pertinent Vitals/Pain Pain Assessment: Faces Faces Pain Scale: Hurts a little bit Pain Location: chest Pain Descriptors / Indicators: Aching Pain Intervention(s): Limited activity within patient's tolerance    Home Living Family/patient expects to be discharged to:: Private residence Living Arrangements: Spouse/significant other;Children Available Help at Discharge: Family;Available 24 hours/day Type of Home: House Home Access: Stairs to enter Entrance Stairs-Rails: Left Entrance Stairs-Number of Steps: 1 Home Layout: One level Home Equipment: None      Prior Function Level of Independence: Independent         Comments: pt likes to mow lawns     Hand Dominance        Extremity/Trunk Assessment   Upper Extremity Assessment Upper Extremity Assessment: RUE deficits/detail RUE Deficits / Details: 2-/5 grip strength R hand, elbow and shoulder at least 3/5 based on observation RUE Sensation: decreased light touch    Lower Extremity Assessment Lower Extremity Assessment: Overall WFL for tasks assessed    Cervical / Trunk Assessment  Cervical / Trunk Assessment: Normal  Communication   Communication: No difficulties  Cognition Arousal/Alertness: Awake/alert Behavior During Therapy: WFL for tasks  assessed/performed Overall Cognitive Status: No family/caregiver present to determine baseline cognitive functioning Area of Impairment: Orientation                 Orientation Level: Disoriented to;Time             General Comments: pt stating year as 2002 and then 2000.      General Comments      Exercises     Assessment/Plan    PT Assessment Patient needs continued PT services  PT Problem List Decreased activity tolerance;Decreased balance;Decreased mobility;Decreased knowledge of precautions;Cardiopulmonary status limiting activity       PT Treatment Interventions DME instruction;Gait training;Stair training;Functional mobility training;Therapeutic activities;Therapeutic exercise;Patient/family education    PT Goals (Current goals can be found in the Care Plan section)  Acute Rehab PT Goals Patient Stated Goal: To go home PT Goal Formulation: With patient Time For Goal Achievement: 03/05/19 Potential to Achieve Goals: Good    Frequency Min 3X/week   Barriers to discharge        Co-evaluation               AM-PAC PT "6 Clicks" Mobility  Outcome Measure Help needed turning from your back to your side while in a flat bed without using bedrails?: None Help needed moving from lying on your back to sitting on the side of a flat bed without using bedrails?: None Help needed moving to and from a bed to a chair (including a wheelchair)?: None Help needed standing up from a chair using your arms (e.g., wheelchair or bedside chair)?: None Help needed to walk in hospital room?: None Help needed climbing 3-5 steps with a railing? : A Little 6 Click Score: 23    End of Session Equipment Utilized During Treatment: Gait belt Activity Tolerance: Other (comment)(limits due to dizziness) Patient left: in bed;with call bell/phone within reach Nurse Communication: Mobility status PT Visit Diagnosis: Other abnormalities of gait and mobility (R26.89)    Time:  [id number] PT Time Calculation (min) (ACUTE ONLY): 25 min   Charges:   PT Evaluation $PT Eval Low Complexity: [place], PT, DPT Acute Rehabilitation Pager: [phone]   Zenaida Niece 02/19/2019, 2:02 PM

## 2019-02-19 NOTE — Plan of Care (Signed)
Poc progressing.  

## 2019-02-19 NOTE — Progress Notes (Signed)
Progress Note  Patient Name: Roger [name] Date of Encounter: 02/19/2019  Primary Cardiologist: Shelva Majestic, MD   Subjective   Chest is sore  No SOB   Inpatient Medications    Scheduled Meds: . acetaminophen  1,000 mg Oral Q6H   Or  . acetaminophen (TYLENOL) oral liquid 160 mg/5 mL  1,000 mg Per Tube Q6H  . amiodarone  200 mg Oral BID  . aspirin EC  325 mg Oral Daily  . Chlorhexidine Gluconate Cloth  6 each Topical Daily  . colchicine  0.6 mg Oral Daily  . mouth rinse  15 mL Mouth Rinse BID  . metoprolol tartrate  25 mg Per Tube TID   Or  . metoprolol tartrate  25 mg Oral TID  . pantoprazole  40 mg Oral Daily  . rosuvastatin  40 mg Oral q1800  . sodium chloride flush  3 mL Intravenous Once  . sodium chloride flush  3 mL Intravenous Q12H  . sodium chloride flush  3 mL Intravenous Q12H  . sodium chloride flush  3 mL Intravenous Q12H  . sodium chloride flush  3 mL Intravenous Q12H   Continuous Infusions: . sodium chloride Stopped (02/14/19 2216)  . sodium chloride    . sodium chloride    . sodium chloride    . sodium chloride 20 mL/hr at 02/14/19 1842  . sodium chloride     PRN Meds: sodium chloride, sodium chloride, sodium chloride, influenza vaccine adjuvanted, methocarbamol, metoprolol tartrate, ondansetron (ZOFRAN) IV, oxyCODONE, sodium chloride flush, sodium chloride flush, sodium chloride flush, traMADol   Vital Signs    Vitals:   02/18/19 2148 02/18/19 2339 02/19/19 0424 02/19/19 0825  BP: 101/68 121/72 118/74 120/70  Pulse: 67 71 75 (!) 59  Resp: 17 15 20 20   Temp: 98.4 F (36.9 C) 98.6 F (37 C) 98.3 F (36.8 C) 97.8 F (36.6 C)  TempSrc: Oral Oral Oral Oral  SpO2: 96% 96% 97% 97%  Weight:   74.3 kg   Height:        Intake/Output Summary (Last 24 hours) at 02/19/2019 1039 Last data filed at 02/18/2019 2300 Gross per 24 hour  Intake 390 ml  Output -  Net 390 ml   Last 3 Weights 02/19/2019 02/18/2019 02/17/2019  Weight (lbs) 163 lb  14.4 oz 166 lb 11.2 oz 185 lb 6.5 oz  Weight (kg) 74.345 kg 75.615 kg 84.1 kg      Telemetry    SR - Personally Reviewed  ECG      Physical Exam   GEN: No acute distress.   Neck: No JVD Cardiac: RRR, no murmurs, rubs, or gallops.  Respiratory: Clear to auscultation bilaterally. GI: Soft, nontender, non-distended  MS: No edema;  Labs    High Sensitivity Troponin:   Recent Labs  Lab 02/12/19 1259 02/12/19 1522 02/12/19 2116 02/12/19 2317 02/14/19 1143  TROPONINIHS 35* 75* 103* 97* 18*      Chemistry Recent Labs  Lab 02/16/19 0428 02/18/19 0439 02/19/19 0505  NA 128* 137 137  K 4.0 3.9 3.8  CL 96* 100 103  CO2 22 27 24   GLUCOSE 117* 100* 108*  BUN 18 17 16   CREATININE 1.34* 1.37* 1.23  CALCIUM 8.0* 8.7* 8.8*  PROT  --  6.2*  --   ALBUMIN  --  2.7*  --   AST  --  20  --   ALT  --  15  --   ALKPHOS  --  94  --  BILITOT  --  0.9  --   GFRNONAA 53* 52* 59*  GFRAA >60 >60 >60  ANIONGAP 10 10 10      Hematology Recent Labs  Lab 02/17/19 0253 02/18/19 0439 02/19/19 0505  WBC 9.5 8.9 7.9  RBC 2.71* 2.81* 2.78*  HGB 9.6* 10.0* 9.6*  HCT 27.7* 28.9* 28.4*  MCV 102.2* 102.8* 102.2*  MCH 35.4* 35.6* 34.5*  MCHC 34.7 34.6 33.8  RDW 12.7 12.6 12.4  PLT 138* 199 244    BNP Recent Labs  Lab 02/12/19 1259  BNP 59.6     DDimer No results for input(s): DDIMER in the last 168 hours.   Radiology    Dg Chest 2 View  Result Date: 02/19/2019 CLINICAL DATA:  : Hx of CABG ,hx htn EXAM: CHEST - 2 VIEW COMPARISON:  Chest radiograph 02/18/2019 FINDINGS: Stable cardiomediastinal contours status post median sternotomy and CABG. Continued improved aeration at the bilateral lung bases. Mild residual streaky opacities likely representing atelectasis. No pneumothorax or pleural effusion. No acute finding in the visualized skeleton. IMPRESSION: Continued improved aeration at the lung bases with mild residual atelectasis. Electronically Signed   By: Audie Pinto  M.D.   On: 02/19/2019 08:17   Dg Chest Port 1 View  Result Date: 02/18/2019 CLINICAL DATA:  Weakness. Chest pain. Catheter removal. Chest tube removal. EXAM: PORTABLE CHEST 1 VIEW COMPARISON:  02/16/2019 FINDINGS: Right internal jugular venous access sheath and left chest tube have been removed. Improved aeration at the lung bases with mild to moderate residual atelectasis. No worsening or new finding. IMPRESSION: Chest tube removal. No pneumothorax. Improved aeration at the lung bases with mild to moderate residual atelectasis. Electronically Signed   By: Nelson Chimes M.D.   On: 02/18/2019 07:33     Patient Profile     73 y.o. male HTN, HL, admitted with CP and SOB   Now S/P CABG  Assessment & Plan    1  CAD   Pt s/p CABG x3 on 02/14/19 (LIMA to LAD; SVG to Ramus ; SVG to diagonal)  Echo prior to surgery LVEF 40 to 40%  SHould have f/u as outpt in a few months to document recovery of function NOte plans for possible d/c today  WIll make sure he has close f/u in clinic  IF BP up on return consider adding ARB.     2   Post op atrial fibrillation Remains in SR   Keep on amiodarone      Agree with plans to decreased B [place]   WIll watch HR as amiodarone loads into his system   For questions or updates, please contact [place] Please consult [email or web address] for contact info under        Signed, Dorris Carnes, MD  02/19/2019, 10:39 AM

## 2019-02-19 NOTE — Progress Notes (Signed)
CARDIAC REHAB PHASE I   PRE:  Rate/Rhythm: 11 SR  BP:  Sitting: 117/76      SaO2: 98 RA  MODE:  Ambulation: 150 ft   POST:  Rate/Rhythm: 89 SR  BP:  Sitting: 147/82    SaO2: 97 RA  Pt ambulated 136ft in hallway, pt requesting to turn around and complained of dizziness. Encouraged pt to take standing rest break. Pt still c/o of dizziness. Pt sat in chair O2 sats checked, after some time still c/o dizziness. Pt helped to wheelchair and wheeled back to room. Pt helped to recliner, BP fine. Pt still c/o slight dizziness. RN at bedside. Encouraged pt to call for help before getting up. Will return later to complete education if pt discharging.   [id number] Rufina Falco, RN BSN 02/19/2019 10:59 AM

## 2019-02-19 NOTE — TOC Transition Note (Signed)
Transition of Care [facility]) - CM/SW Discharge Note Marvetta Gibbons RN, BSN Transitions of Care Unit 4E- RN Case Manager [phone]   Patient Details  Name: Roger [name] MRN: [id number] Date of Birth: [birth year]  Transition of Care [facility]) CM/SW Contact:  Dawayne Patricia, RN Phone Number: 02/19/2019, 4:50 PM   Clinical Narrative:    Pt stable for transition home today s/p CABG. Orders placed for HHPT and DME- RW and 3n1- CM spoke with pt and wife at bedside- list provided for choice Per CMS guidelines from medicare.gov website with star ratings (copy placed in shadow chart)- per wife first choice is [facility], second choice [facility], and then [facility] 3rd choice- Pt will get DME with [employer] and have delivered to room prior to discharge. Call made to [facility] with [employer] for DME needs. Call made to [facility] for [facility] needs- however they are unable to accept referral- [facility]. Pt to transport home with wife.   Final next level of care: [place] Barriers to Discharge: No Barriers Identified   Patient Goals and CMS Choice Patient states their goals for this hospitalization and ongoing recovery are:: to get a shower CMS Medicare.gov Compare Post Acute Care list provided to:: Patient Choice offered to / list presented to : Patient  Discharge Placement             Home with [facility]          Discharge Plan and Services In-house Referral: Clinical Social Work Discharge Planning Services: CM Consult Post Acute Care Choice: Durable Medical Equipment, Home Health          DME Arranged: 3-N-1, Walker rolling DME Agency: [employer] Date DME Agency Contacted: 02/19/19 Time DME Agency Contacted: [id number] Representative spoke with at DME Agency: [place]: PT [place]: [place] ([place]) Date [place]: 02/19/19 Time [place]: 1520 Representative spoke with at  [place]: [name] ([place]) Interventions     Readmission Risk Interventions Readmission Risk Prevention Plan 02/19/2019  Post Dischage Appt Complete  Medication Screening Complete  Transportation Screening Complete  Some recent data might be hidden

## 2019-02-19 NOTE — Progress Notes (Signed)
Discharge AVS meds take and those due reviewed with pt. Follow up appointments and when to call MD reviewed. All questions and concerns addressed. No further questions at this time. D/c IV and TELE, CCMD notified. D/C home per orders. Pt brought out with [facility] equipment and belongings via wheelchair with staff. Amanda Cockayne, RN

## 2019-02-19 NOTE — Progress Notes (Signed)
CARDIAC REHAB PHASE I   D/c ed completed with pt and wife. Pt educated on importance of showers and monitoring incisions daily. Encouraged continued walks, IS use, and sternal precautions. Pt given in-the-tube sheet along with heart healthy diet. Reviewed restrictions and site care. Pt continues to c/o R hand weakness, RN and MD aware. CM at bedside to arrange home needs. Will refer to CRP II [place] Rufina Falco, RN BSN 02/19/2019 3:12 PM

## 2019-02-19 NOTE — Care Management Important Message (Signed)
Important Message  Patient Details  Name: Roger [name] MRN: [id number] Date of Birth: [birth year]   Medicare Important Message Given:  Yes     Shelda Altes 02/19/2019, 1:32 PM

## 2019-02-19 NOTE — Progress Notes (Addendum)
      [address]       [place] [address]             [phone]        5 Days Post-Op Procedure(s) (LRB): CORONARY ARTERY BYPASS GRAFTING (CABG) TIMES THREE USING LEFT INTERNAL MAMMARY ARTERY AND ENDOSCOPIC HARVESTING OF RIGHT GREATER SAPHENOUS VEIN (N/A) TRANSESOPHAGEAL ECHOCARDIOGRAM (TEE) (N/A)  Subjective: Patient had diarrhea but none this am. He also has complaints of right hand weakness since surgery.  Objective: Vital signs in last 24 hours: Temp:  [98 F (36.7 C)-98.6 F (37 C)] 98.3 F (36.8 C) (10/19 0424) Pulse Rate:  [67-80] 75 (10/19 0424) Cardiac Rhythm: Normal sinus rhythm (10/19 0424) Resp:  [14-23] 20 (10/19 0424) BP: (101-130)/(60-78) 118/74 (10/19 0424) SpO2:  [94 %-99 %] 97 % (10/19 0424) Weight:  [74.3 kg] 74.3 kg (10/19 0424)  Pre op weight 76.6 kg Current Weight  02/19/19 74.3 kg       Intake/Output from previous day: 10/18 0701 - 10/19 0700 In: 630 [P.O.:630] Out: -    Physical Exam:  Cardiovascular: RRR Pulmonary: Clear to auscultation bilaterally Abdomen: Soft, non tender, bowel sounds present. Extremities: No lower extremity edema. Wounds: Clean and dry.  No erythema or signs of infection. Neurologic: right hand sensory intact but with weakness'palpable radial and ulnar pulses; RLE motor/sensory intact  Lab Results: CBC: Recent Labs    02/18/19 0439 02/19/19 0505  WBC 8.9 7.9  HGB 10.0* 9.6*  HCT 28.9* 28.4*  PLT 199 244   BMET:  Recent Labs    02/18/19 0439  NA 137  K 3.9  CL 100  CO2 27  GLUCOSE 100*  BUN 17  CREATININE 1.37*  CALCIUM 8.7*    PT/INR:  Lab Results  Component Value Date   INR 1.3 (H) 02/14/2019   ABG:  INR: Will add last result for INR, ABG once components are confirmed Will add last 4 CBG results once components are confirmed  Assessment/Plan:  1. CV - Previous PAF. SR in the 60's this am. On Amiodarone 200 mg bid and Lopressor 25 mg tid but will decrease as SB  into the 50's and mostly SR with HR in the 60's.  2.  Pulmonary - On room air. PA/LAT CXR ordered for this am but not taken yet. Encourage incentive spirometer. 3.  Acute blood loss anemia - H and H this am stable at 9.6 and 28.4 4. Supplement potassium 5. Creatinine this am decreased to 1.23 6. Regarding right hand weakness, pulses intact and hand warm. His motor is weaker than on the left but sensory intact. ? Related to stretching of brachial plexus during surgery, although no symptoms on other side. Uncertain of etiology of right hand weakness. As discussed with Dr. Orvan Seen, will order PT as may need at home 7. Stop stool softeners 8. Hope to discharge later today  [facility] ZimmermanPA-C 02/19/2019,7:00 AM [phone]

## 2019-02-20 ENCOUNTER — Telehealth (HOSPITAL_COMMUNITY): Payer: Self-pay

## 2019-02-20 ENCOUNTER — Other Ambulatory Visit: Payer: Self-pay | Admitting: *Deleted

## 2019-02-20 NOTE — Telephone Encounter (Signed)
Pt insurance is active and benefits verified through [employer] [place] [employer]. Co-pay $20.00, DED $0.00/$0.00 met, out of pocket $4,500.00/$40.00 met, co-insurance 0%. No pre-authorization required. Passport, 02/20/2019 @ 4:20PM, UTM#[id number]  Will contact patient to see if he is interested in the Cardiac Rehab Program. If interested, patient will need to complete follow up appt. Once completed, patient will be contacted for scheduling upon review by the RN Navigator.

## 2019-02-21 NOTE — Telephone Encounter (Signed)
Called patient to see if he is interested in the Cardiac Rehab Program. Patient expressed interest. Explained scheduling process and went over insurance, patient verbalized understanding. Will contact patient for scheduling once f/u has been completed.  °

## 2019-02-22 ENCOUNTER — Other Ambulatory Visit: Payer: Self-pay | Admitting: Cardiothoracic Surgery

## 2019-02-22 DIAGNOSIS — Z951 Presence of aortocoronary bypass graft: Secondary | ICD-10-CM

## 2019-02-23 ENCOUNTER — Other Ambulatory Visit: Payer: Self-pay

## 2019-02-23 ENCOUNTER — Ambulatory Visit
Admission: RE | Admit: 2019-02-23 | Discharge: 2019-02-23 | Disposition: A | Discharge status: Home / Self Care | Payer: Medicare Other | Source: Ambulatory Visit | Attending: Cardiothoracic Surgery | Admitting: Cardiothoracic Surgery

## 2019-02-23 ENCOUNTER — Ambulatory Visit (INDEPENDENT_AMBULATORY_CARE_PROVIDER_SITE_OTHER): Payer: Self-pay | Admitting: Cardiothoracic Surgery

## 2019-02-23 VITALS — BP 135/74 | HR 56 | Temp 97.9°F | Resp 20 | Wt 165.2 lb

## 2019-02-23 DIAGNOSIS — Z951 Presence of aortocoronary bypass graft: Secondary | ICD-10-CM

## 2019-02-28 NOTE — Progress Notes (Signed)
      [address]       [place] [address]             [place] OFFICE NOTE  Referring Provider is Troy Sine, MD Primary Cardiologist is Shelva Majestic, MD PCP is Chesley Noon, MD   HPI:  73 yo man returns for 1st postoperative visit after urgent cabg last week. He did well except for a brief period of atrial fibrillation, now resolved. He has had difficulty with hoarseness and right hand weakness, which may be slightly better. No chest pain or shortness of breath.    Current Outpatient Medications  Medication Sig Dispense Refill  . amiodarone (PACERONE) 200 MG tablet Take 1 tablet (200 mg total) by mouth daily. 30 tablet 1  . aspirin EC 325 MG tablet Take 1 tablet (325 mg total) by mouth daily. 42 tablet 0  . meloxicam (MOBIC) 15 MG tablet Take 15 mg by mouth daily.    . metoprolol tartrate (LOPRESSOR) 25 MG tablet Take 1 tablet (25 mg total) by mouth 2 (two) times daily. 60 tablet 1  . oxyCODONE (OXY IR/ROXICODONE) 5 MG immediate release tablet Take 1 tablet (5 mg total) by mouth every 4 (four) hours as needed for severe pain. 28 tablet 0  . rosuvastatin (CRESTOR) 40 MG tablet Take 1 tablet (40 mg total) by mouth daily at 6 PM. 30 tablet 1  . zolpidem (AMBIEN) 10 MG tablet Take 10 mg by mouth at bedtime.    . methocarbamol (ROBAXIN) 500 MG tablet Take 1 tablet (500 mg total) by mouth every 6 (six) hours as needed (muscle spasm). (Patient not taking: Reported on 02/23/2019) 40 tablet 0   No current facility-administered medications for this visit.       Physical Exam:   BP 135/74 (BP Location: Right Arm)   Pulse (!) 56   Temp 97.9 F (36.6 C) (Skin)   Resp 20   Wt 74.9 kg   SpO2 98% Comment: RA  BMI 23.70 kg/m   General:  Well appearing, nad  Chest:   cta  CV:   rrr  Incisions:  Well-healed  Abdomen:  sntnd  Extremities:  No edema; 3/5 strength in right hand; proximal motor intact  Diagnostic Tests:  CXR  clear lung fields.    Impression:  73 yo man underwent urgent cabg for severe angina not responsive to aggressive medical therapy. He has done well but has RUE weakness possibly due to ulnar nerve compression   Plan:  Refer to PT for right hand weakness F/u in 2-3 weeks  I spent in excess of 20 minutes during the conduct of this office consultation and >50% of this time involved direct face-to-face encounter with the patient for counseling and/or coordination of their care.  Level 2                 10 minutes Level 3                 15 minutes Level 4                 25 minutes Level 5                 40 minutes  Aryssa Rosamond Z. Orvan Seen, [place] 02/28/2019 10:23 AM

## 2019-03-06 NOTE — Progress Notes (Signed)
Cardiology Office Note   Date:  03/07/2019   ID:  Roger [name], DOB 1946/03/09, MRN [id number]  PCP:  Chesley Noon, MD  Cardiologist:  Shelva Majestic, MD EP: None  Chief Complaint  Patient presents with  . Hospitalization Follow-up    s/p CABG 02/2019      History of Present Illness: Roger [name] is a 73 y.o. male with a PMH of CAD s/p CABG, ischemic cardiomyopathy/chronic combined CHF, HTN, HLD, who presents for post-hospital follow-up.   He was admitted to [facility] from 02/12/2019-02/19/2019 after presenting with unstable angina. HE underwent a LHC 02/13/2019 which showed complex ostial to proximal LAD stenosis. He was recommended for CABG which occurred 02/14/2019 with LIMA to LAD, SVG to ramus, and SVG to 1st diagonal. Echo showed EF 40-45%, G1DD, and no significant valvular abnormalities. His post-op course was complicated by atrial fibrillation with conversion to NSR with amiodarone.   He returns today for post-hospital follow-up. He reports doing well since discharge. No recurrent chest pain, SOB, or LE edema. He has occasional lightheadedness when standing up quickly. We discussed orthostatic precautions. He has been working with rehab at home and reports improvement in his right hand weakness. No complaints of orthopnea, PND, syncope, palpitations, or bleeding. His incision site is healing well.    Past Medical History:  Diagnosis Date  . Chronic back pain   . HLD (hyperlipidemia)   . HTN (hypertension)     Past Surgical History:  Procedure Laterality Date  . BACK SURGERY    . CARDIAC CATHETERIZATION    . CORONARY ARTERY BYPASS GRAFT N/A 02/14/2019   Procedure: CORONARY ARTERY BYPASS GRAFTING (CABG) TIMES THREE USING LEFT INTERNAL MAMMARY ARTERY AND ENDOSCOPIC HARVESTING OF RIGHT GREATER SAPHENOUS VEIN;  Surgeon: [name], MD;  Location: [facility];  Service: Open Heart Surgery;  Laterality: N/A;  . LEFT HEART CATH AND CORONARY ANGIOGRAPHY N/A  02/13/2019   Procedure: LEFT HEART CATH AND CORONARY ANGIOGRAPHY;  Surgeon: Leonie Man, MD;  Location: [facility];  Service: Cardiovascular;  Laterality: N/A;  . TEE WITHOUT CARDIOVERSION N/A 02/14/2019   Procedure: TRANSESOPHAGEAL ECHOCARDIOGRAM (TEE);  Surgeon: [name], MD;  Location: [facility];  Service: Open Heart Surgery;  Laterality: N/A;     Current Outpatient Medications  Medication Sig Dispense Refill  . amiodarone (PACERONE) 200 MG tablet Take 1 tablet (200 mg total) by mouth daily. 30 tablet 1  . aspirin EC 325 MG tablet Take 1 tablet (325 mg total) by mouth daily. 42 tablet 0  . meloxicam (MOBIC) 15 MG tablet Take 15 mg by mouth daily.    . methocarbamol (ROBAXIN) 500 MG tablet Take 1 tablet (500 mg total) by mouth every 6 (six) hours as needed (muscle spasm). 40 tablet 0  . oxyCODONE (OXY IR/ROXICODONE) 5 MG immediate release tablet Take 1 tablet (5 mg total) by mouth every 4 (four) hours as needed for severe pain. 28 tablet 0  . rosuvastatin (CRESTOR) 40 MG tablet Take 1 tablet (40 mg total) by mouth daily at 6 PM. 30 tablet 1  . zolpidem (AMBIEN) 10 MG tablet Take 10 mg by mouth at bedtime.    . metoprolol succinate (TOPROL-XL) 50 MG 24 hr tablet Take 1 tablet (50 mg total) by mouth daily. Take with or immediately following a meal. 90 tablet 3   No current facility-administered medications for this visit.     Allergies:   Hydrocodone-acetaminophen, Temazepam, and Trazodone and nefazodone  Social History:  The patient  reports that he has never smoked. He has never used smokeless tobacco. He reports that he does not drink alcohol or use drugs.   Family History:  The patient's family history includes Heart disease in his brother and father.    ROS:  Please see the history of present illness.   Otherwise, review of systems are positive for none.   All other systems are reviewed and negative.    PHYSICAL EXAM: VS:  BP 108/66   Pulse 69   Ht 5\' 10"   (1.778 m)   Wt 161 lb 14.4 oz (73.4 kg)   BMI 23.23 kg/m  , BMI Body mass index is 23.23 kg/m. GEN: Well nourished, well developed, in no acute distress HEENT: sclera anicteric Neck: no JVD, carotid bruits, or masses Cardiac: RRR; no murmurs, rubs, or gallops, no edema; sternal incision site healing well without erythema or drainage Respiratory:  clear to auscultation bilaterally, normal work of breathing GI: soft, nontender, nondistended, + BS MS: no deformity or atrophy Skin: warm and dry, no rash Neuro:  Strength and sensation are intact Psych: euthymic mood, full affect   EKG:  EKG is ordered today. The ekg ordered today demonstrates NSR, rate 69 bpm, non-specific T wave abnormalities, no STE/D; QTc 432   Recent Labs: 02/12/2019: B Natriuretic Peptide 59.6 02/15/2019: Magnesium 2.3 02/18/2019: ALT 15 02/19/2019: BUN 16; Creatinine, Ser 1.23; Hemoglobin 9.6; Platelets 244; Potassium 3.8; Sodium 137    Lipid Panel No results found for: CHOL, TRIG, HDL, CHOLHDL, VLDL, LDLCALC, LDLDIRECT    Wt Readings from Last 3 Encounters:  03/07/19 161 lb 14.4 oz (73.4 kg)  02/23/19 165 lb 3.2 oz (74.9 kg)  02/19/19 163 lb 14.4 oz (74.3 kg)      Other studies Reviewed: Additional studies/ records that were reviewed today include:   LEFT HEART CATH AND CORONARY ANGIOGRAPHY 02/13/2019  Conclusion    There is mild to moderate left ventricular systolic dysfunction.  LV end diastolic pressure is normal.  The left ventricular ejection fraction is ~40-45% by visual estimate. Anterior hypokinesis.  ----------------------------------------------  Ost LAD to Prox LAD lesion is 80% stenosed.  Prox LAD to Mid LAD lesion is 70% stenosed with 50% stenosed side branch in 1st Diag.  Ramus lesion is 30% stenosed.  Otherwise normal coronaries  SUMMARY  Severe ostial and proximal LAD with extensive disease segment. Otherwise normal ramus intermedius and dominant circumflex.  Excellent LAD and diagonal targets  Mildly reduced EF with anterior hypokinesis.  Normal LVEDP  RECOMMENDATIONS  Return to nursing unit, restart IV heparin 8 hours after sheath removal.  Discussed images with Dr. Claiborne Billings, who agrees that prior to considering relatively high risk ostial LAD with very long lesion to cover with stents, the best option will be to consider CVTS consultation with PCI as a backup option.  Continue aggressive restart medication.   Echocardiogram 02/13/2019: IMPRESSIONS    1. Left ventricular ejection fraction, by visual estimation, is 40 to 45%. The left ventricle has mild to moderately decreased function. Normal left ventricular size. There is mildly increased left ventricular hypertrophy.  2. Left ventricular diastolic Doppler parameters are consistent with impaired relaxation pattern of LV diastolic filling.  3. Global right ventricle has normal systolic function.The right ventricular size is normal. No increase in right ventricular wall thickness.  4. Left atrial size was normal.  5. Right atrial size was normal.  6. The mitral valve is normal in structure. Trace mitral valve regurgitation.  7. The tricuspid valve is normal in structure. Tricuspid valve regurgitation is trivial.  8. The aortic valve is tricuspid Aortic valve regurgitation was not visualized by color flow Doppler.  9. Normal pulmonary artery systolic pressure. 10. The inferior vena cava is normal in size with greater than 50% respiratory variability, suggesting right atrial pressure of 3 mmHg.   ASSESSMENT AND PLAN:  1. CAD s/p CABG 02/2019: doing well since discharge. No anginal complaints. Incision site healing well - Continue aspirin 325mg  daily - anticipate he will decrease dose to 81mg  daily q month post surgery. Will defer this to Dr. Orvan Seen - Continue crestor - Continue metoprolol - will transition to succinate   2. Ischemic cardiomyopathy/chronic combined CHF: appears to be  euvolemic. BP limits ability to add ACEi/ARB.  - Will transition to metoprolol succinate 50mg  daily  - Will plan to repeat echo in 3 months to monitor for improvement in EF - Continue low salt diet and daily weight monitoring  3. HTN: BP 108/66 today - Continue metoprolol  4. HLD: LDL 115 04/2018 - Continue crestor  5. Post-op atrial fibrillation: maintaining sinus rhythm.  - Plan to stop amiodarone 03/21/2019   Current medicines are reviewed at length with the patient today.  The patient does not have concerns regarding medicines.  The following changes have been made:  As above  Labs/ tests ordered today include:   Orders Placed This Encounter  Procedures  . EKG 12-Lead  . ECHOCARDIOGRAM COMPLETE     Disposition:   FU with Dr. Claiborne Billings in 6 months  Signed, Abigail Butts, PA-C  03/07/2019 2:32 PM

## 2019-03-07 ENCOUNTER — Ambulatory Visit (INDEPENDENT_AMBULATORY_CARE_PROVIDER_SITE_OTHER): Payer: Medicare Other | Admitting: Medical

## 2019-03-07 ENCOUNTER — Encounter: Payer: Self-pay | Admitting: Medical

## 2019-03-07 ENCOUNTER — Ambulatory Visit: Payer: Medicare Other | Admitting: Medical

## 2019-03-07 ENCOUNTER — Other Ambulatory Visit: Payer: Self-pay

## 2019-03-07 VITALS — BP 108/66 | HR 69 | Ht 70.0 in | Wt 161.9 lb

## 2019-03-07 DIAGNOSIS — Z951 Presence of aortocoronary bypass graft: Secondary | ICD-10-CM

## 2019-03-07 DIAGNOSIS — I10 Essential (primary) hypertension: Secondary | ICD-10-CM | POA: Diagnosis not present

## 2019-03-07 DIAGNOSIS — I5042 Chronic combined systolic (congestive) and diastolic (congestive) heart failure: Secondary | ICD-10-CM

## 2019-03-07 DIAGNOSIS — I2510 Atherosclerotic heart disease of native coronary artery without angina pectoris: Secondary | ICD-10-CM

## 2019-03-07 DIAGNOSIS — E782 Mixed hyperlipidemia: Secondary | ICD-10-CM

## 2019-03-07 MED ORDER — METOPROLOL SUCCINATE ER 50 MG PO TB24: 50.0000 mg | ORAL_TABLET | Freq: Every day | ORAL | 3 refills | Status: DC

## 2019-03-07 NOTE — Patient Instructions (Signed)
Medication Instructions:   STOP METOPROLOL TARTRATE  START METOPROLOL SUCCINATE 50 MG DAILY  *If you need a refill on your cardiac medications before your next appointment, please call your pharmacy*  Lab Work:  NONE ordered at this time of appointment   If you have labs (blood work) drawn today and your tests are completely normal, you will receive your results only by: Marland Kitchen MyChart Message (if you have MyChart) OR . A paper copy in the mail If you have any lab test that is abnormal or we need to change your treatment, we will call you to review the results.  Testing/Procedures: Your physician has requested that you have an echocardiogram. Echocardiography is a painless test that uses sound waves to create images of your heart. It provides your doctor with information about the size and shape of your heart and how well your heart's chambers and valves are working. This procedure takes approximately one hour. There are no restrictions for this procedure.   PLEASE SCHEDULE FOR January 2021  Follow-Up: At [facility], you and your health needs are our priority.  As part of our continuing mission to provide you with exceptional heart care, we have created designated Provider Care Teams.  These Care Teams include your primary Cardiologist (physician) and Advanced Practice Providers (APPs -  Physician Assistants and Nurse Practitioners) who all work together to provide you with the care you need, when you need it.  Your next appointment:   6 months  The format for your next appointment:   Virtual Visit   Provider:   Shelva Majestic, MD  Other Instructions

## 2019-03-15 ENCOUNTER — Other Ambulatory Visit: Payer: Self-pay | Admitting: Cardiothoracic Surgery

## 2019-03-15 DIAGNOSIS — Z951 Presence of aortocoronary bypass graft: Secondary | ICD-10-CM

## 2019-03-16 ENCOUNTER — Ambulatory Visit (INDEPENDENT_AMBULATORY_CARE_PROVIDER_SITE_OTHER): Payer: Medicare Other | Admitting: Cardiothoracic Surgery

## 2019-03-16 ENCOUNTER — Other Ambulatory Visit: Payer: Self-pay

## 2019-03-16 ENCOUNTER — Ambulatory Visit
Admission: RE | Admit: 2019-03-16 | Discharge: 2019-03-16 | Disposition: A | Discharge status: Home / Self Care | Payer: Medicare Other | Source: Ambulatory Visit | Attending: Cardiothoracic Surgery | Admitting: Cardiothoracic Surgery

## 2019-03-16 VITALS — BP 120/75 | HR 80 | Temp 97.6°F | Resp 20 | Ht 70.0 in | Wt 167.0 lb

## 2019-03-16 DIAGNOSIS — Z951 Presence of aortocoronary bypass graft: Secondary | ICD-10-CM

## 2019-03-16 NOTE — Progress Notes (Signed)
      [address]       [place] [address]             [facility] OFFICE NOTE  Referring Provider is Troy Sine, MD Primary Cardiologist is Shelva Majestic, MD PCP is Chesley Noon, MD   HPI:  73 yo man presents for 2nd office visit after recent d/c from hosp where he underwent urgetn CABG for progresive angina and unfavorable LAD anatomy. He has had transient Afib, not resolved.  No complaints of angina or SOB/DOE.   Current Outpatient Medications  Medication Sig Dispense Refill  . amiodarone (PACERONE) 200 MG tablet Take 1 tablet (200 mg total) by mouth daily. 30 tablet 1  . aspirin EC 325 MG tablet Take 1 tablet (325 mg total) by mouth daily. 42 tablet 0  . meloxicam (MOBIC) 15 MG tablet Take 15 mg by mouth daily.    . methocarbamol (ROBAXIN) 500 MG tablet Take 1 tablet (500 mg total) by mouth every 6 (six) hours as needed (muscle spasm). 40 tablet 0  . metoprolol succinate (TOPROL-XL) 50 MG 24 hr tablet Take 1 tablet (50 mg total) by mouth daily. Take with or immediately following a meal. 90 tablet 3  . oxyCODONE (OXY IR/ROXICODONE) 5 MG immediate release tablet Take 1 tablet (5 mg total) by mouth every 4 (four) hours as needed for severe pain. 28 tablet 0  . rosuvastatin (CRESTOR) 40 MG tablet Take 1 tablet (40 mg total) by mouth daily at 6 PM. 30 tablet 1  . zolpidem (AMBIEN) 10 MG tablet Take 10 mg by mouth at bedtime.     No current facility-administered medications for this visit.       Physical Exam:   BP 120/75   Pulse 80   Temp 97.6 F (36.4 C) (Skin)   Resp 20   Ht 5\' 10"  (1.778 m)   Wt 75.8 kg   SpO2 97% Comment: RA  BMI 23.96 kg/m   General:  Well-appearing, NAD  Chest:   cta  CV:   rrr  Incisions:  C/d/i  Abdomen:  sntnd  Extremities:  Well-perfused  Diagnostic Tests:  CXR with clear lung fields   Impression:  Doing well after urgent cabg; his right hand weakness is improving, as is his  hoarseness (these are unrelated)  Plan:  F/u as needed   I spent in excess of 15 minutes during the conduct of this office consultation and >50% of this time involved direct face-to-face encounter with the patient for counseling and/or coordination of their care.  Level 2                 10 minutes Level 3                 15 minutes Level 4                 25 minutes Level 5                 40 minutes  B. Murvin Natal, MD 03/16/2019 12:22 PM

## 2019-03-20 ENCOUNTER — Telehealth (HOSPITAL_COMMUNITY): Payer: Self-pay

## 2019-03-20 NOTE — Telephone Encounter (Signed)
Called patient to see if he was interested in participating in the Cardiac Rehab Program. Patient stated yes. Patient will come in for orientation on 04/17/2019 @ 930AM and will attend the 04/23/2019 exercise class.  Tourist information centre manager.

## 2019-03-21 ENCOUNTER — Other Ambulatory Visit: Payer: Self-pay | Admitting: *Deleted

## 2019-03-21 DIAGNOSIS — Z951 Presence of aortocoronary bypass graft: Secondary | ICD-10-CM

## 2019-04-06 ENCOUNTER — Telehealth (HOSPITAL_COMMUNITY): Payer: Self-pay | Admitting: Pharmacist

## 2019-04-06 NOTE — Telephone Encounter (Signed)
Cardiac Rehab Medication Review by a Pharmacist  Does the patient  feel that his/her medications are working for him/her?  Yes - these medications have been working.  Has the patient been experiencing any side effects to the medications prescribed?  No- Currently no side effects from medciations  Does the patient measure his/her own blood pressure or blood glucose at home?  No - Patient does not monitor blood pressure at home. Encouraged [name] to monitor BP at home and it's significance on CVD.  Does the patient have any problems obtaining medications due to transportation or finances?   No - Currently no issues obtaining medications  Understanding of regimen: excellent Understanding of indications: excellent Potential of compliance: excellent   Pharmacist comments: Counseled [name] to avoid the use of NSAIDs such as Ibuprofen, naproxen, and meloxicam given his history of CVD. I offered acetaminophen as an alternative to manage pain. [name] verbalized understanding  Consider the addition of a low dose ACEI/ARB if blood pressure allows.    Roger [name] 04/06/2019 3:27 PM

## 2019-04-07 ENCOUNTER — Other Ambulatory Visit: Payer: Self-pay | Admitting: Physician Assistant

## 2019-04-12 ENCOUNTER — Telehealth: Payer: Self-pay | Admitting: Cardiovascular Disease

## 2019-04-12 NOTE — Telephone Encounter (Signed)
Okay with me Bellany Elbaum, MD  

## 2019-04-12 NOTE — Telephone Encounter (Signed)
Patient is wanting to switch from [facility] to Dr.Skains. Please advise.

## 2019-04-17 ENCOUNTER — Other Ambulatory Visit: Payer: Self-pay

## 2019-04-17 ENCOUNTER — Encounter (HOSPITAL_COMMUNITY)
Admission: RE | Admit: 2019-04-17 | Discharge: 2019-04-17 | Disposition: A | Discharge status: Home / Self Care | Payer: Medicare Other | Source: Ambulatory Visit | Attending: Cardiology | Admitting: Cardiology

## 2019-04-17 DIAGNOSIS — Z951 Presence of aortocoronary bypass graft: Secondary | ICD-10-CM

## 2019-04-17 NOTE — Progress Notes (Signed)
Mr Roger [name]. Roger [name] presented to Cardiac Rehab Orientation today as scheduled. VSS. Upon signing consent to participate and financial responsibility, patient decided he could not incur the 20.00 copay/visit. He was informed that monies would not be collected in the department and he could set up payment plan with billing department. Patient declined. Not appropriate for virtual CR as patient does not have computer or smart phone. Patient encouraged to call [phone] if he changes his mind. Kem Parcher E. Laray Anger, BSN

## 2019-04-22 NOTE — Telephone Encounter (Signed)
Ok by me

## 2019-04-23 ENCOUNTER — Ambulatory Visit (HOSPITAL_COMMUNITY): Payer: Medicare Other

## 2019-04-23 ENCOUNTER — Other Ambulatory Visit: Payer: Self-pay | Admitting: Physician Assistant

## 2019-04-25 ENCOUNTER — Ambulatory Visit (HOSPITAL_COMMUNITY): Payer: Medicare Other

## 2019-04-30 ENCOUNTER — Ambulatory Visit (HOSPITAL_COMMUNITY): Payer: Medicare Other

## 2019-05-01 ENCOUNTER — Other Ambulatory Visit: Payer: Self-pay | Admitting: Physician Assistant

## 2019-05-02 ENCOUNTER — Ambulatory Visit (HOSPITAL_COMMUNITY): Payer: Medicare Other

## 2019-05-07 ENCOUNTER — Other Ambulatory Visit: Payer: Self-pay

## 2019-05-07 ENCOUNTER — Ambulatory Visit (HOSPITAL_COMMUNITY): Payer: Medicare Other | Attending: Internal Medicine

## 2019-05-07 ENCOUNTER — Ambulatory Visit (HOSPITAL_COMMUNITY): Payer: Medicare Other

## 2019-05-07 DIAGNOSIS — I2510 Atherosclerotic heart disease of native coronary artery without angina pectoris: Secondary | ICD-10-CM

## 2019-05-07 DIAGNOSIS — I5042 Chronic combined systolic (congestive) and diastolic (congestive) heart failure: Secondary | ICD-10-CM

## 2019-05-09 ENCOUNTER — Ambulatory Visit (HOSPITAL_COMMUNITY): Payer: Medicare Other

## 2019-05-11 ENCOUNTER — Ambulatory Visit (HOSPITAL_COMMUNITY): Payer: Medicare Other

## 2019-05-14 ENCOUNTER — Ambulatory Visit (HOSPITAL_COMMUNITY): Payer: Medicare Other

## 2019-05-16 ENCOUNTER — Ambulatory Visit (HOSPITAL_COMMUNITY): Payer: Medicare Other

## 2019-05-18 ENCOUNTER — Ambulatory Visit (HOSPITAL_COMMUNITY): Payer: Medicare Other

## 2019-05-18 ENCOUNTER — Other Ambulatory Visit: Payer: Self-pay | Admitting: Physician Assistant

## 2019-05-21 ENCOUNTER — Ambulatory Visit (HOSPITAL_COMMUNITY): Payer: Medicare Other

## 2019-05-23 ENCOUNTER — Ambulatory Visit (HOSPITAL_COMMUNITY): Payer: Medicare Other

## 2019-05-25 ENCOUNTER — Ambulatory Visit (HOSPITAL_COMMUNITY): Payer: Medicare Other

## 2019-05-28 ENCOUNTER — Ambulatory Visit (HOSPITAL_COMMUNITY): Payer: Medicare Other

## 2019-05-30 ENCOUNTER — Ambulatory Visit (HOSPITAL_COMMUNITY): Payer: Medicare Other

## 2019-06-01 ENCOUNTER — Ambulatory Visit (HOSPITAL_COMMUNITY): Payer: Medicare Other

## 2019-06-04 ENCOUNTER — Ambulatory Visit (HOSPITAL_COMMUNITY): Payer: Medicare Other

## 2019-06-06 ENCOUNTER — Ambulatory Visit (HOSPITAL_COMMUNITY): Payer: Medicare Other

## 2019-06-08 ENCOUNTER — Ambulatory Visit (HOSPITAL_COMMUNITY): Payer: Medicare Other

## 2019-06-11 ENCOUNTER — Ambulatory Visit (HOSPITAL_COMMUNITY): Payer: Medicare Other

## 2019-06-13 ENCOUNTER — Ambulatory Visit (HOSPITAL_COMMUNITY): Payer: Medicare Other

## 2019-06-15 ENCOUNTER — Ambulatory Visit (HOSPITAL_COMMUNITY): Payer: Medicare Other

## 2019-09-03 ENCOUNTER — Encounter: Payer: Self-pay | Admitting: Cardiology

## 2019-09-03 ENCOUNTER — Other Ambulatory Visit: Payer: Self-pay

## 2019-09-03 ENCOUNTER — Ambulatory Visit: Payer: Medicare Other | Admitting: Cardiology

## 2019-09-03 VITALS — BP 130/80 | HR 90 | Ht 70.0 in | Wt 77.0 lb

## 2019-09-03 DIAGNOSIS — I10 Essential (primary) hypertension: Secondary | ICD-10-CM | POA: Diagnosis not present

## 2019-09-03 DIAGNOSIS — E782 Mixed hyperlipidemia: Secondary | ICD-10-CM

## 2019-09-03 DIAGNOSIS — Z951 Presence of aortocoronary bypass graft: Secondary | ICD-10-CM

## 2019-09-03 DIAGNOSIS — I5042 Chronic combined systolic (congestive) and diastolic (congestive) heart failure: Secondary | ICD-10-CM

## 2019-09-03 DIAGNOSIS — I2510 Atherosclerotic heart disease of native coronary artery without angina pectoris: Secondary | ICD-10-CM

## 2019-09-03 NOTE — Patient Instructions (Signed)
Medication Instructions:  The current medical regimen is effective;  continue present plan and medications.  *If you need a refill on your cardiac medications before your next appointment, please call your pharmacy*  Follow-Up: At [facility], you and your health needs are our priority.  As part of our continuing mission to provide you with exceptional heart care, we have created designated Provider Care Teams.  These Care Teams include your primary Cardiologist (physician) and Advanced Practice Providers (APPs -  Physician Assistants and Nurse Practitioners) who all work together to provide you with the care you need, when you need it.  We recommend signing up for the patient portal called "MyChart".  Sign up information is provided on this After Visit Summary.  MyChart is used to connect with patients for Virtual Visits (Telemedicine).  Patients are able to view lab/test results, encounter notes, upcoming appointments, etc.  Non-urgent messages can be sent to your provider as well.   To learn more about what you can do with MyChart, go to [email or web address].    Your next appointment:   6 month(s)  The format for your next appointment:   In Person  Provider:   You may see Candee Furbish, MD or one of the following Advanced Practice Providers on your designated Care Team:    Truitt Merle, NP  Cecilie Kicks, NP  Kathyrn Drown, NP   Thank you for choosing [facility]!!

## 2019-09-03 NOTE — Progress Notes (Signed)
Cardiology Office Note:    Date:  09/03/2019   ID:  Roger [name], DOB 22-Jun-1945, MRN [id number]  PCP:  Chesley Noon, MD  Cardiologist:  Candee Furbish, MD  Electrophysiologist:  None   Referring MD: Chesley Noon, MD     History of Present Illness:    Roger [name] is a 74 y.o. male here for the follow-up of coronary artery disease status post CABG with ischemic cardiomyopathy systolic heart failure hypertension hyperlipidemia.  02/14/2019-CABG  LIMA to LAD, SVG to ramus, and SVG to 1st diagonal. Echo showed EF 40-45%, G1DD, and no significant valvular abnormalities. His post-op course was complicated by atrial fibrillation with conversion to NSR with amiodarone.  His latest echocardiogram from January 2021 was reviewed and his pump function had improved up to 45 to 50%.  He is continuing with Toprol 50.  He has not been on ACE inhibitor or ARB, creatinine last check was 1.25.  Was seen last by Cherlynn Polo, PA on 03/07/2019.  Doing well at that time.  Dr. Orvan Seen has also seen.   Feels well without any difficulties.  No fevers chills nausea vomiting syncope bleeding.    Past Medical History:  Diagnosis Date  . Chronic back pain   . HLD (hyperlipidemia)   . HTN (hypertension)     Past Surgical History:  Procedure Laterality Date  . BACK SURGERY    . CARDIAC CATHETERIZATION    . CORONARY ARTERY BYPASS GRAFT N/A 02/14/2019   Procedure: CORONARY ARTERY BYPASS GRAFTING (CABG) TIMES THREE USING LEFT INTERNAL MAMMARY ARTERY AND ENDOSCOPIC HARVESTING OF RIGHT GREATER SAPHENOUS VEIN;  Surgeon: [name], MD;  Location: [place];  Service: Open Heart Surgery;  Laterality: N/A;  . LEFT HEART CATH AND CORONARY ANGIOGRAPHY N/A 02/13/2019   Procedure: LEFT HEART CATH AND CORONARY ANGIOGRAPHY;  Surgeon: Leonie Man, MD;  Location: [place] [facility];  Service: Cardiovascular;  Laterality: N/A;  . TEE WITHOUT CARDIOVERSION N/A 02/14/2019   Procedure:  TRANSESOPHAGEAL ECHOCARDIOGRAM (TEE);  Surgeon: [name], MD;  Location: [place];  Service: Open Heart Surgery;  Laterality: N/A;    Current Medications: Current Meds  Medication Sig  . aspirin EC 81 MG tablet Take 81 mg by mouth daily.  . metoprolol succinate (TOPROL-XL) 50 MG 24 hr tablet Take 1 tablet (50 mg total) by mouth daily. Take with or immediately following a meal.  . Multiple Vitamin (MULTIVITAMIN WITH MINERALS) TABS tablet Take 1 tablet by mouth daily.  . rosuvastatin (CRESTOR) 40 MG tablet Take 1 tablet (40 mg total) by mouth daily at 6 PM.  . zolpidem (AMBIEN) 10 MG tablet Take 10 mg by mouth at bedtime as needed.      Allergies:   Hydrocodone-acetaminophen, Temazepam, and Trazodone and nefazodone   Social History   Socioeconomic History  . Marital status: Married    Spouse name: Not on file  . Number of children: Not on file  . Years of education: Not on file  . Highest education level: Not on file  Occupational History  . Not on file  Tobacco Use  . Smoking status: Never Smoker  . Smokeless tobacco: Never Used  Substance and Sexual Activity  . Alcohol use: No  . Drug use: Never  . Sexual activity: Not on file  Other Topics Concern  . Not on file  Social History Narrative  . Not on file   Social Determinants of Health   Financial Resource Strain:   .  Difficulty of Paying Living Expenses:   Food Insecurity:   . Worried About Charity fundraiser in the Last Year:   . Arboriculturist in the Last Year:   Transportation Needs:   . Film/video editor (Medical):   Marland Kitchen Lack of Transportation (Non-Medical):   Physical Activity:   . Days of Exercise per Week:   . Minutes of Exercise per Session:   Stress:   . Feeling of Stress :   Social Connections:   . Frequency of Communication with Friends and Family:   . Frequency of Social Gatherings with Friends and Family:   . Attends Religious Services:   . Active Member of Clubs or Organizations:   .  Attends Archivist Meetings:   Marland Kitchen Marital Status:      Family History: The patient's family history includes Heart disease in his brother and father.  ROS:   Please see the history of present illness.     All other systems reviewed and are negative.  EKGs/Labs/Other Studies Reviewed:    The following studies were reviewed today:  Echo 05/07/19:  1. Left ventricular ejection fraction, by visual estimation, is 45 to  50%. The left ventricle has mildly decreased function. Left ventricular  septal wall thickness was moderately increased. Mildly increased left  ventricular posterior wall thickness.  There is mildly increased left ventricular hypertrophy.  2. Moderate hypokinesis of the left ventricular, mid-apical anterior  wall.  3. Left ventricular diastolic parameters are consistent with Grade I  diastolic dysfunction (impaired relaxation).  4. The left ventricle demonstrates regional wall motion abnormalities.  5. Global right ventricle has low normal systolic function.The right  ventricular size is normal. No increase in right ventricular wall  thickness.  6. Left atrial size was moderately dilated.  7. Right atrial size was normal.  8. Mild mitral annular calcification.  9. The mitral valve is abnormal. Mild mitral valve regurgitation.  10. The tricuspid valve is grossly normal.  11. The aortic valve is tricuspid. Aortic valve regurgitation is trivial.  Mild aortic valve sclerosis without stenosis.  12. The pulmonic valve was grossly normal. Pulmonic valve regurgitation is  trivial.  13. Mildly elevated pulmonary artery systolic pressure.  14. The inferior vena cava is normal in size with greater than 50%  respiratory variability, suggesting right atrial pressure of 3 mmHg.  15. A prior study was performed on 02/13/2019.  16. Changes from prior study are noted.  17. Prior LVEF 40-45% with anterior hypokinesis  Cardiac catheterization  02/13/2019: Diagnostic Dominance: Left    EKG:  EKG is not ordered today.  03/07/2019-sinus rhythm 69 bpm.  Recent Labs: 02/12/2019: B Natriuretic Peptide 59.6 02/15/2019: Magnesium 2.3 02/18/2019: ALT 15 02/19/2019: BUN 16; Creatinine, Ser 1.23; Hemoglobin 9.6; Platelets 244; Potassium 3.8; Sodium 137  Recent Lipid Panel No results found for: CHOL, TRIG, HDL, CHOLHDL, VLDL, LDLCALC, LDLDIRECT  Physical Exam:    VS:  BP 130/80   Pulse 90   Ht 5\' 10"  (1.778 m)   Wt 77 lb (34.9 kg)   SpO2 97%   BMI 11.05 kg/m     Wt Readings from Last 3 Encounters:  09/03/19 77 lb (34.9 kg)  03/16/19 167 lb (75.8 kg)  03/07/19 161 lb 14.4 oz (73.4 kg)     GEN:  Well nourished, well developed in no acute distress HEENT: Normal NECK: No JVD; No carotid bruits LYMPHATICS: No lymphadenopathy CARDIAC: RRR, no murmurs, rubs, gallops RESPIRATORY:  Clear to auscultation without  rales, wheezing or rhonchi  ABDOMEN: Soft, non-tender, non-distended MUSCULOSKELETAL:  No edema; No deformity  SKIN: Warm and dry NEUROLOGIC:  Alert and oriented x 3 PSYCHIATRIC:  Normal affect   ASSESSMENT:    1. Coronary artery disease involving native coronary artery of native heart without angina pectoris   2. S/P CABG x 3   3. Chronic combined systolic and diastolic heart failure ([place])   4. Essential hypertension   5. Mixed hyperlipidemia    PLAN:    In order of problems listed above:  Coronary artery disease status post CABG -Continue with aspirin, beta-blocker, statin.  Overall feels well.  Mildly reduced ejection fraction -EF has improved post CABG to 45 to 50%.  Continue with Toprol.  He has not been on angiotensin receptor blocker or ACE inhibitor.  For now, continue with current plan.  Last creatinine was 1.23 on Care Everywhere.  Hyperlipidemia -Continue with Crestor 40 mg.  No myalgias.  Dr. Melford Aase checked lipids and LDL was approximately 80.  Goal is less than 70.  If with continued diet and  exercise he does not reach goal, we can consider adding Zetia 10 mg a day to his regimen.  Lab work checked from care everywhere.   Medication Adjustments/Labs and Tests Ordered: Current medicines are reviewed at length with the patient today.  Concerns regarding medicines are outlined above.  No orders of the defined types were placed in this encounter.  No orders of the defined types were placed in this encounter.   Patient Instructions  Medication Instructions:  The current medical regimen is effective;  continue present plan and medications.  *If you need a refill on your cardiac medications before your next appointment, please call your pharmacy*  Follow-Up: At [facility], you and your health needs are our priority.  As part of our continuing mission to provide you with exceptional heart care, we have created designated Provider Care Teams.  These Care Teams include your primary Cardiologist (physician) and Advanced Practice Providers (APPs -  Physician Assistants and Nurse Practitioners) who all work together to provide you with the care you need, when you need it.  We recommend signing up for the patient portal called "MyChart".  Sign up information is provided on this After Visit Summary.  MyChart is used to connect with patients for Virtual Visits (Telemedicine).  Patients are able to view lab/test results, encounter notes, upcoming appointments, etc.  Non-urgent messages can be sent to your provider as well.   To learn more about what you can do with MyChart, go to [email or web address].    Your next appointment:   6 month(s)  The format for your next appointment:   In Person  Provider:   You may see Candee Furbish, MD or one of the following Advanced Practice Providers on your designated Care Team:    Truitt Merle, NP  Cecilie Kicks, NP  Kathyrn Drown, NP   Thank you for choosing [facility]!!        Signed, Candee Furbish, MD  09/03/2019 10:02 AM     [place]

## 2019-10-11 ENCOUNTER — Other Ambulatory Visit: Payer: Self-pay

## 2019-10-11 MED ORDER — METOPROLOL SUCCINATE ER 50 MG PO TB24: 50.0000 mg | ORAL_TABLET | Freq: Every day | ORAL | 3 refills | Status: DC

## 2020-05-27 IMAGING — DX DG CHEST 1V PORT
1 series · 1 of 1 positions shown · non-contrast
Comparison: 02/16/2019

CLINICAL DATA: Weakness. Chest pain. Catheter removal. Chest tube
removal.

EXAM:
PORTABLE CHEST 1 VIEW

[chest ap]
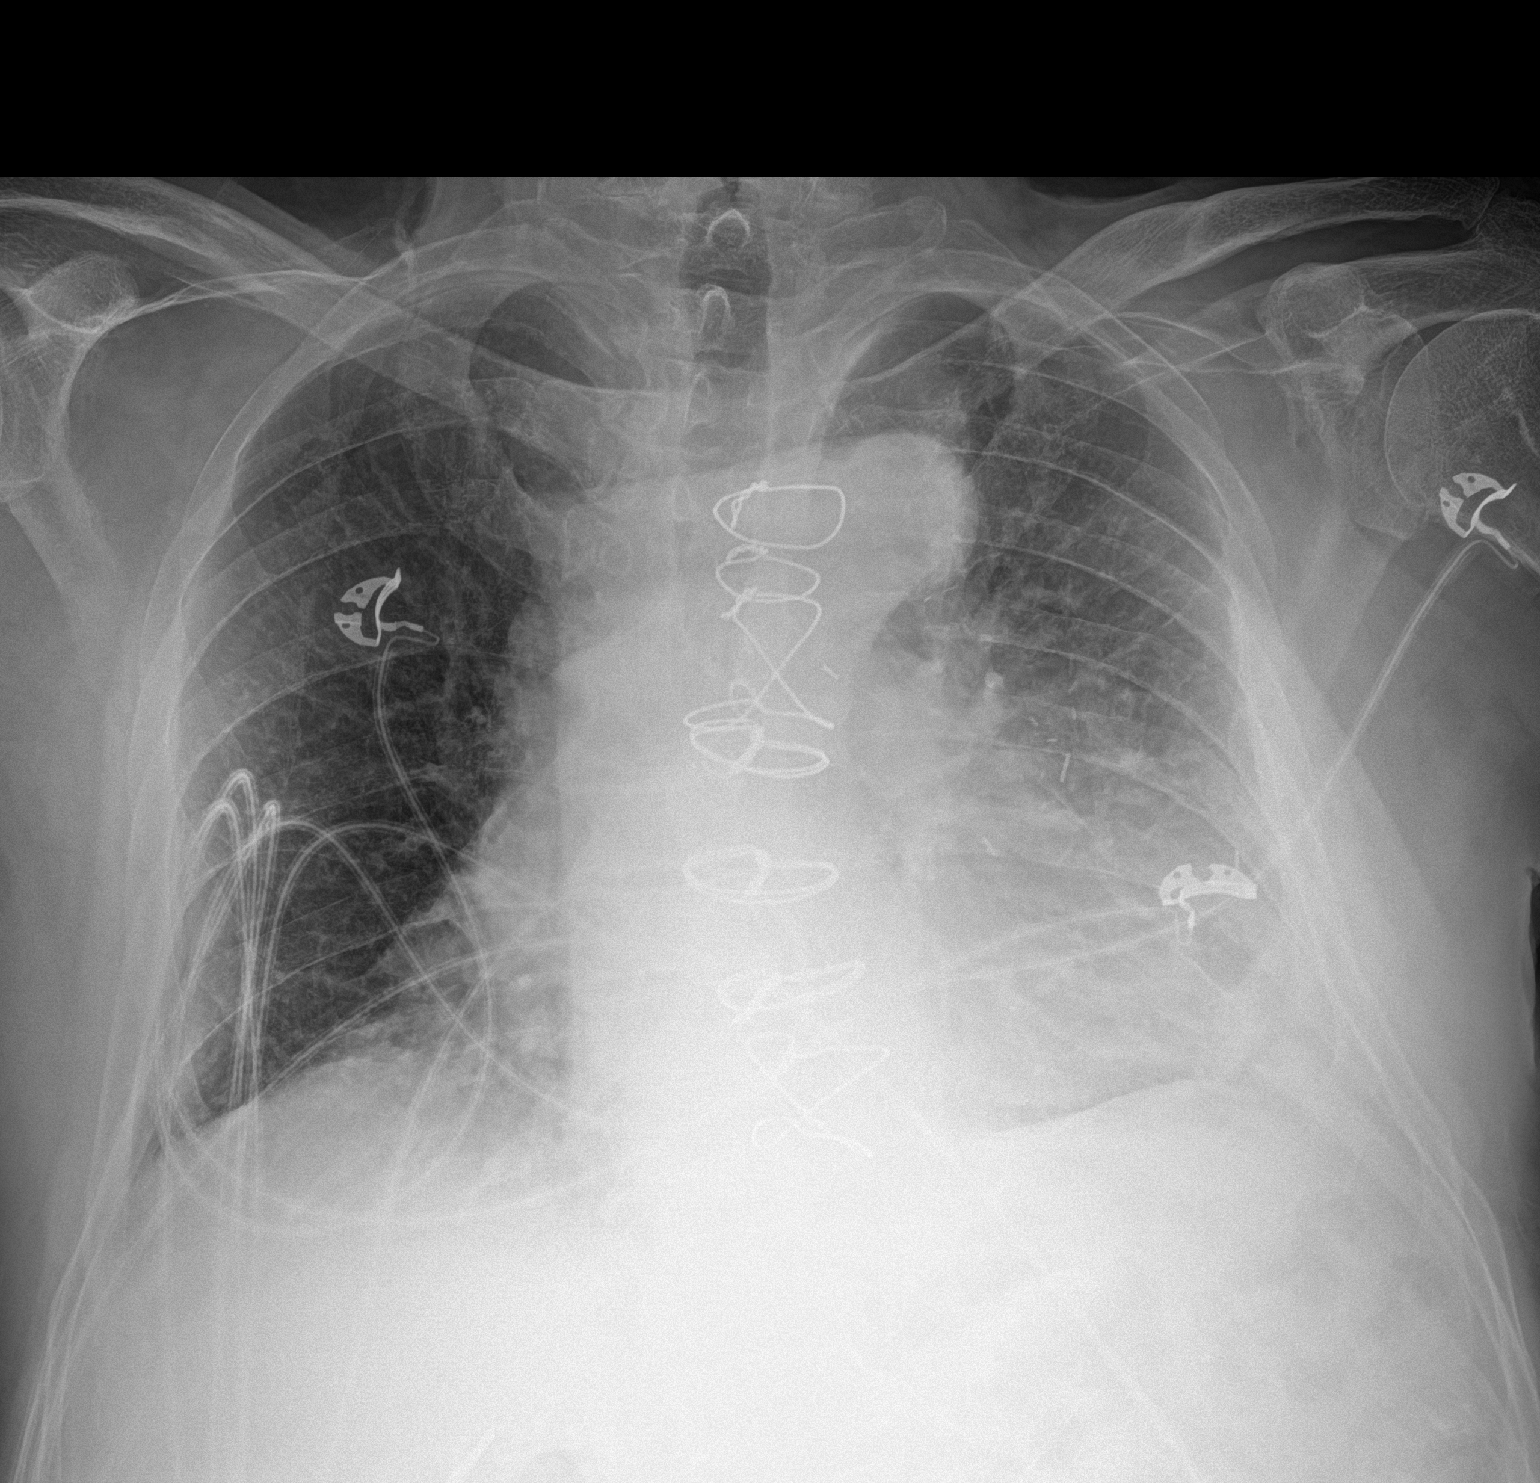

[1 of 1 positions shown; findings below may reference images not displayed]

FINDINGS: Right internal jugular venous access sheath and left chest tube have
been removed. Improved aeration at the lung bases with mild to
moderate residual atelectasis. No worsening or new finding.
IMPRESSION: Chest tube removal. No pneumothorax. Improved aeration at the lung
bases with mild to moderate residual atelectasis.

## 2020-05-28 ENCOUNTER — Telehealth (INDEPENDENT_AMBULATORY_CARE_PROVIDER_SITE_OTHER): Payer: Medicare Other | Admitting: Cardiology

## 2020-05-28 ENCOUNTER — Other Ambulatory Visit: Payer: Self-pay

## 2020-05-28 ENCOUNTER — Encounter: Payer: Self-pay | Admitting: Cardiology

## 2020-05-28 VITALS — Ht 69.0 in | Wt 195.0 lb

## 2020-05-28 DIAGNOSIS — Z951 Presence of aortocoronary bypass graft: Secondary | ICD-10-CM

## 2020-05-28 DIAGNOSIS — E782 Mixed hyperlipidemia: Secondary | ICD-10-CM | POA: Diagnosis not present

## 2020-05-28 DIAGNOSIS — I2510 Atherosclerotic heart disease of native coronary artery without angina pectoris: Secondary | ICD-10-CM | POA: Diagnosis not present

## 2020-05-28 DIAGNOSIS — I10 Essential (primary) hypertension: Secondary | ICD-10-CM

## 2020-05-28 DIAGNOSIS — I255 Ischemic cardiomyopathy: Secondary | ICD-10-CM

## 2020-05-28 NOTE — Patient Instructions (Signed)
Medication Instructions:  Your physician recommends that you continue on your current medications as directed. Please refer to the Current Medication list given to you today.  *If you need a refill on your cardiac medications before your next appointment, please call your pharmacy*   Lab Work: CBC, BMET, ALT, and Fasting lipid panel  If you have labs (blood work) drawn today and your tests are completely normal, you will receive your results only by: Marland Kitchen MyChart Message (if you have MyChart) OR . A paper copy in the mail If you have any lab test that is abnormal or we need to change your treatment, we will call you to review the results.   Testing/Procedures: NONE   Follow-Up: At [facility], you and your health needs are our priority.  As part of our continuing mission to provide you with exceptional heart care, we have created designated Provider Care Teams.  These Care Teams include your primary Cardiologist (physician) and Advanced Practice Providers (APPs -  Physician Assistants and Nurse Practitioners) who all work together to provide you with the care you need, when you need it.   Your next appointment:   6 month(s)  The format for your next appointment:   In Person  Provider:   You may see Candee Furbish, MD or one of the following Advanced Practice Providers on your designated Care Team:    Kathyrn Drown, NP

## 2020-05-28 NOTE — Progress Notes (Signed)
Virtual Visit via Telephone Note   This visit type was conducted due to national recommendations for restrictions regarding the COVID-19 Pandemic (e.g. social distancing) in an effort to limit this patient's exposure and mitigate transmission in our community.  Due to his co-morbid illnesses, this patient is at least at moderate risk for complications without adequate follow up.  This format is felt to be most appropriate for this patient at this time.  The patient did not have access to video technology/had technical difficulties with video requiring transitioning to audio format only (telephone).  All issues noted in this document were discussed and addressed.  No physical exam could be performed with this format.  Please refer to the patient's chart for his  consent to telehealth for [facility]    Date:  05/28/2020   ID:  Roger [name], DOB Dec 26, 1945, MRN [id number] The patient was identified using 2 identifiers.  Patient Location: Home Provider Location: Home Office  PCP:  Chesley Noon, MD  Cardiologist:  Candee Furbish, MD  Electrophysiologist:  None   Evaluation Performed:  Follow-Up Visit  History of Present Illness:    Roger [name] is a 75 y.o. male here for the follow-up of CAD post CABG with ischemic cardiomyopathy prior systolic heart failure, hypertension, hyperlipidemia.  CABG took place on 02/14/2019 with LIMA to LAD SVG to ramus SVG to first diagonal.  He did have brief postop atrial fibrillation conversion with amiodarone to sinus rhythm.  Echocardiogram at that time showed EF 40 to 45% with grade 1 diastolic dysfunction and no significant valvular abnormalities.  Pump function had improved to 45 to 50% post bypass in January 2021 echocardiogram reviewed.  He is on Toprol.  No ACE inhibitor because of elevated creatinine.  Doing well without any fevers chills nausea vomiting syncope bleeding  The patient does not have symptoms concerning for COVID-19  infection (fever, chills, cough, or new shortness of breath).    Past Medical History:  Diagnosis Date  . Chronic back pain   . HLD (hyperlipidemia)   . HTN (hypertension)    Past Surgical History:  Procedure Laterality Date  . BACK SURGERY    . CARDIAC CATHETERIZATION    . CORONARY ARTERY BYPASS GRAFT N/A 02/14/2019   Procedure: CORONARY ARTERY BYPASS GRAFTING (CABG) TIMES THREE USING LEFT INTERNAL MAMMARY ARTERY AND ENDOSCOPIC HARVESTING OF RIGHT GREATER SAPHENOUS VEIN;  Surgeon: [name], MD;  Location: [place];  Service: Open Heart Surgery;  Laterality: N/A;  . LEFT HEART CATH AND CORONARY ANGIOGRAPHY N/A 02/13/2019   Procedure: LEFT HEART CATH AND CORONARY ANGIOGRAPHY;  Surgeon: Leonie Man, MD;  Location: [place] [facility];  Service: Cardiovascular;  Laterality: N/A;  . TEE WITHOUT CARDIOVERSION N/A 02/14/2019   Procedure: TRANSESOPHAGEAL ECHOCARDIOGRAM (TEE);  Surgeon: [name], MD;  Location: [place];  Service: Open Heart Surgery;  Laterality: N/A;     Current Meds  Medication Sig  . aspirin EC 81 MG tablet Take 81 mg by mouth daily.  . metoprolol succinate (TOPROL-XL) 50 MG 24 hr tablet Take 1 tablet (50 mg total) by mouth daily. Take with or immediately following a meal.  . Multiple Vitamin (MULTIVITAMIN WITH MINERALS) TABS tablet Take 1 tablet by mouth daily.  . rosuvastatin (CRESTOR) 40 MG tablet Take 1 tablet (40 mg total) by mouth daily at 6 PM.  . zolpidem (AMBIEN) 10 MG tablet Take 10 mg by mouth at bedtime as needed.      Allergies:  Hydrocodone-acetaminophen, Temazepam, and Trazodone and nefazodone   Social History   Tobacco Use  . Smoking status: Never Smoker  . Smokeless tobacco: Never Used  Vaping Use  . Vaping Use: Never used  Substance Use Topics  . Alcohol use: No  . Drug use: Never     Family Hx: The patient's family history includes Heart disease in his brother and father.  ROS:   Please see the history of present illness.     No fever chills nausea vomiting syncope bleeding All other systems reviewed and are negative.   Prior CV studies:   The following studies were reviewed today:  Echocardiogram 05/07/2019: -EF 45 to 50%.  Mild hypokinesis of the mid apical anterior wall.  Grade 1 diastolic dysfunction.  Trivial AI.  Moderately dilated left atrium, mild MR.  catheterization 02/13/2019: Diagnostic Dominance: Left     Labs/Other Tests and Data Reviewed:    EKG: Sinus rhythm with nonspecific T wave changes from 03/07/2019.  Recent Labs: No results found for requested labs within last 8760 hours.   Recent Lipid Panel No results found for: CHOL, TRIG, HDL, CHOLHDL, LDLCALC, LDLDIRECT  Wt Readings from Last 3 Encounters:  05/28/20 195 lb (88.5 kg)  09/03/19 77 lb (34.9 kg)  03/16/19 167 lb (75.8 kg)     Risk Assessment/Calculations:     Objective:    Vital Signs:  Ht 5\' 9"  (1.753 m)   Wt 195 lb (88.5 kg)   BMI 28.80 kg/m    VITAL SIGNS:  reviewed temperature was ninety-six.  Alert and oriented x3 able to complete full sentences without difficulty.  ASSESSMENT & PLAN:    Coronary artery disease post CABG -Doing well with goal-directed medical therapy.  No anginal symptoms.  Ejection fraction has improved to 45 to 50%.  Has noted some minimal shortness of breath over the past week or so but no associated fevers cough orthopnea fluid gain.  He says that it is very subtle.  We will continue to monitor.  He will let us know if any changes occur.  Ischemic cardiomyopathy -Improvement post bypass.  On Toprol.  Unable to utilize ACE inhibitor or ARB secondary to prior elevated creatinine.  Hyperlipidemia -Continue with high intensity Crestor 40 mg.  Goal LDL less than 70.  We will order a lipid panel, ALT, basic metabolic profile, CBC.  We will touch base again in 6 months.       COVID-19 Education: The signs and symptoms of COVID-19 were discussed with the patient and how to seek  care for testing (follow up with PCP or arrange E-visit).  The importance of social distancing was discussed today.  Time:   Today, I have spent 21 minutes with the patient with telehealth technology discussing the above problems.     Medication Adjustments/Labs and Tests Ordered: Current medicines are reviewed at length with the patient today.  Concerns regarding medicines are outlined above.   Tests Ordered: Orders Placed This Encounter  Procedures  . CBC  . Basic metabolic panel  . ALT  . Lipid panel    Medication Changes: No orders of the defined types were placed in this encounter.   Follow Up:  In Person in 6 month(s)  Signed, Candee Furbish, MD  05/28/2020 10:04 AM    [place]

## 2020-06-02 ENCOUNTER — Other Ambulatory Visit: Payer: Self-pay

## 2020-06-02 ENCOUNTER — Other Ambulatory Visit: Payer: Medicare Other | Admitting: *Deleted

## 2020-06-02 DIAGNOSIS — Z951 Presence of aortocoronary bypass graft: Secondary | ICD-10-CM

## 2020-06-02 DIAGNOSIS — I2510 Atherosclerotic heart disease of native coronary artery without angina pectoris: Secondary | ICD-10-CM

## 2020-06-02 DIAGNOSIS — E782 Mixed hyperlipidemia: Secondary | ICD-10-CM

## 2020-06-02 DIAGNOSIS — I10 Essential (primary) hypertension: Secondary | ICD-10-CM

## 2020-06-02 DIAGNOSIS — I255 Ischemic cardiomyopathy: Secondary | ICD-10-CM

## 2020-06-03 LAB — BASIC METABOLIC PANEL
BUN/Creatinine Ratio: 13 (ref 10–24)
BUN: 17 mg/dL (ref 8–27)
CO2: 25 mmol/L (ref 20–29)
Calcium: 10 mg/dL (ref 8.6–10.2)
Chloride: 103 mmol/L (ref 96–106)
Creatinine, Ser: 1.36 mg/dL — ABNORMAL HIGH (ref 0.76–1.27)
GFR calc Af Amer: 60 mL/min/{1.73_m2} (ref >59)
GFR calc non Af Amer: 52 mL/min/{1.73_m2} — ABNORMAL LOW (ref >59)
Glucose: 97 mg/dL (ref 65–99)
Potassium: 4.4 mmol/L (ref 3.5–5.2)
Sodium: 142 mmol/L (ref 134–144)

## 2020-06-03 LAB — LIPID PANEL
Chol/HDL Ratio: 2.7 ratio (ref 0.0–5.0)
Cholesterol, Total: 257 mg/dL — ABNORMAL HIGH (ref 100–199)
HDL: 96 mg/dL (ref >39)
LDL Chol Calc (NIH): 149 mg/dL — ABNORMAL HIGH (ref 0–99)
Triglycerides: 76 mg/dL (ref 0–149)
VLDL Cholesterol Cal: 12 mg/dL (ref 5–40)

## 2020-06-03 LAB — CBC
Hematocrit: 41.2 % (ref 37.5–51.0)
Hemoglobin: 14.2 g/dL (ref 13.0–17.7)
MCH: 34.8 pg — ABNORMAL HIGH (ref 26.6–33.0)
MCHC: 34.5 g/dL (ref 31.5–35.7)
MCV: 101 fL — ABNORMAL HIGH (ref 79–97)
Platelets: 239 10*3/uL (ref 150–450)
RBC: 4.08 x10E6/uL — ABNORMAL LOW (ref 4.14–5.80)
RDW: 13.1 % (ref 11.6–15.4)
WBC: 8.4 10*3/uL (ref 3.4–10.8)

## 2020-06-03 LAB — ALT: ALT: 10 IU/L (ref 0–44)

## 2020-06-05 ENCOUNTER — Other Ambulatory Visit: Payer: Self-pay | Admitting: *Deleted

## 2020-06-05 DIAGNOSIS — E782 Mixed hyperlipidemia: Secondary | ICD-10-CM

## 2020-06-05 DIAGNOSIS — Z79899 Other long term (current) drug therapy: Secondary | ICD-10-CM

## 2020-06-05 MED ORDER — ROSUVASTATIN CALCIUM 40 MG PO TABS: 40.0000 mg | ORAL_TABLET | Freq: Every day | ORAL | 3 refills | Status: DC

## 2020-06-05 NOTE — Progress Notes (Signed)
Jerline Pain, MD  Shellia Cleverly, RN Cc: P Cv [facility] Triage No anemia, Hgb 14.2  Creatinine 1.36, mildly elevated, stable from one year ago  Normal ALT/ liver function  LDL 149 - goal <70. Previously was around 63. Assume that he has not been on the Crestor 40mg .  Let's go ahead and restart the Crestor 40mg  and recheck lipids in 3 months. If still not at goal of LDL <70, will add Zetia 10mg  PO QD.    Pt is aware of results, will start Crestor 40 and repeat lab in 3 months.  appt scheduled for 09/03/20.

## 2020-06-22 IMAGING — DX DG CHEST 2V
2 series · 2 of 2 positions shown · non-contrast
Comparison: February 23, 2019

CLINICAL DATA: Status post CABG on February 14, 2019.

EXAM:
CHEST - 2 VIEW

[dg chest 2 view (1 of 2)]
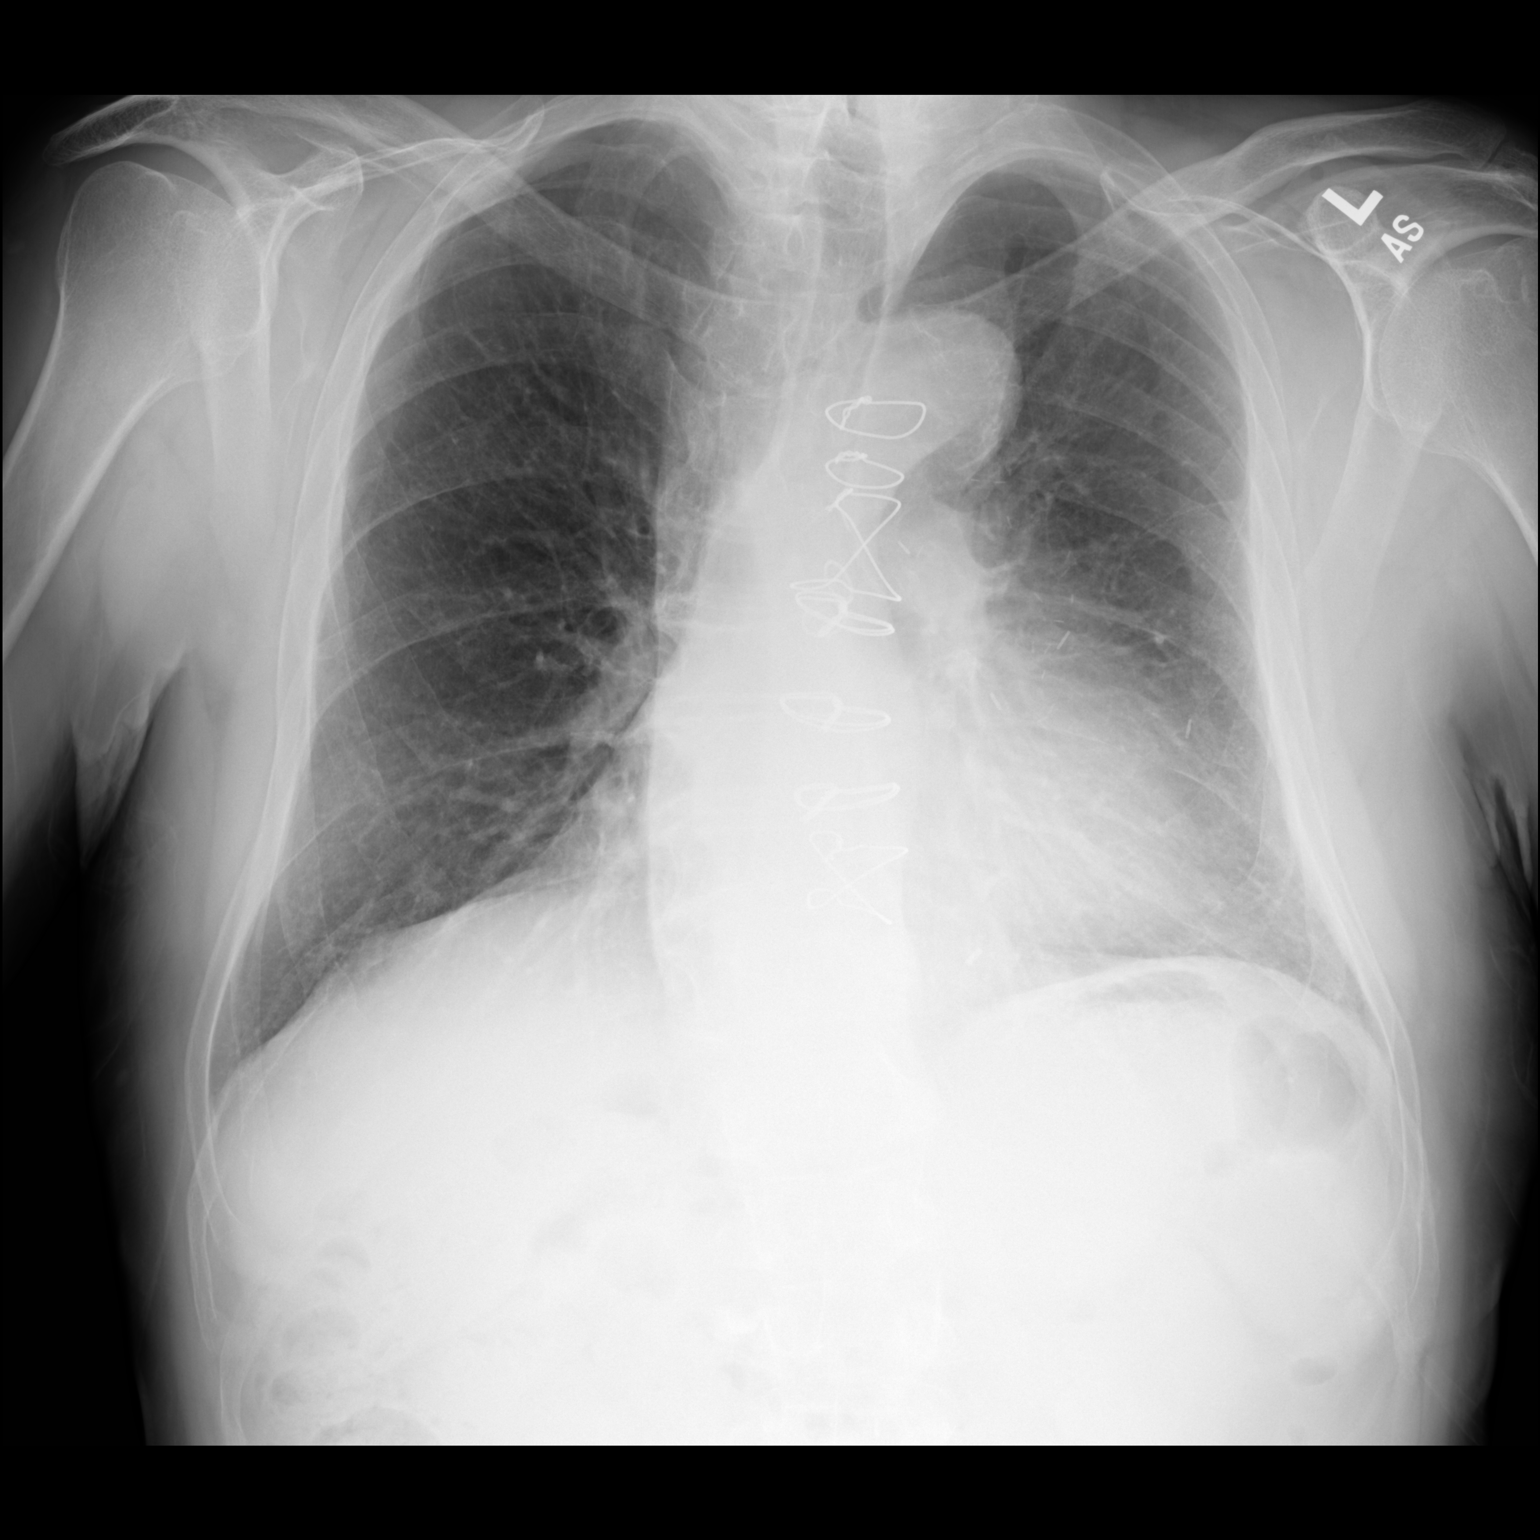

[dg chest 2 view (2 of 2)]
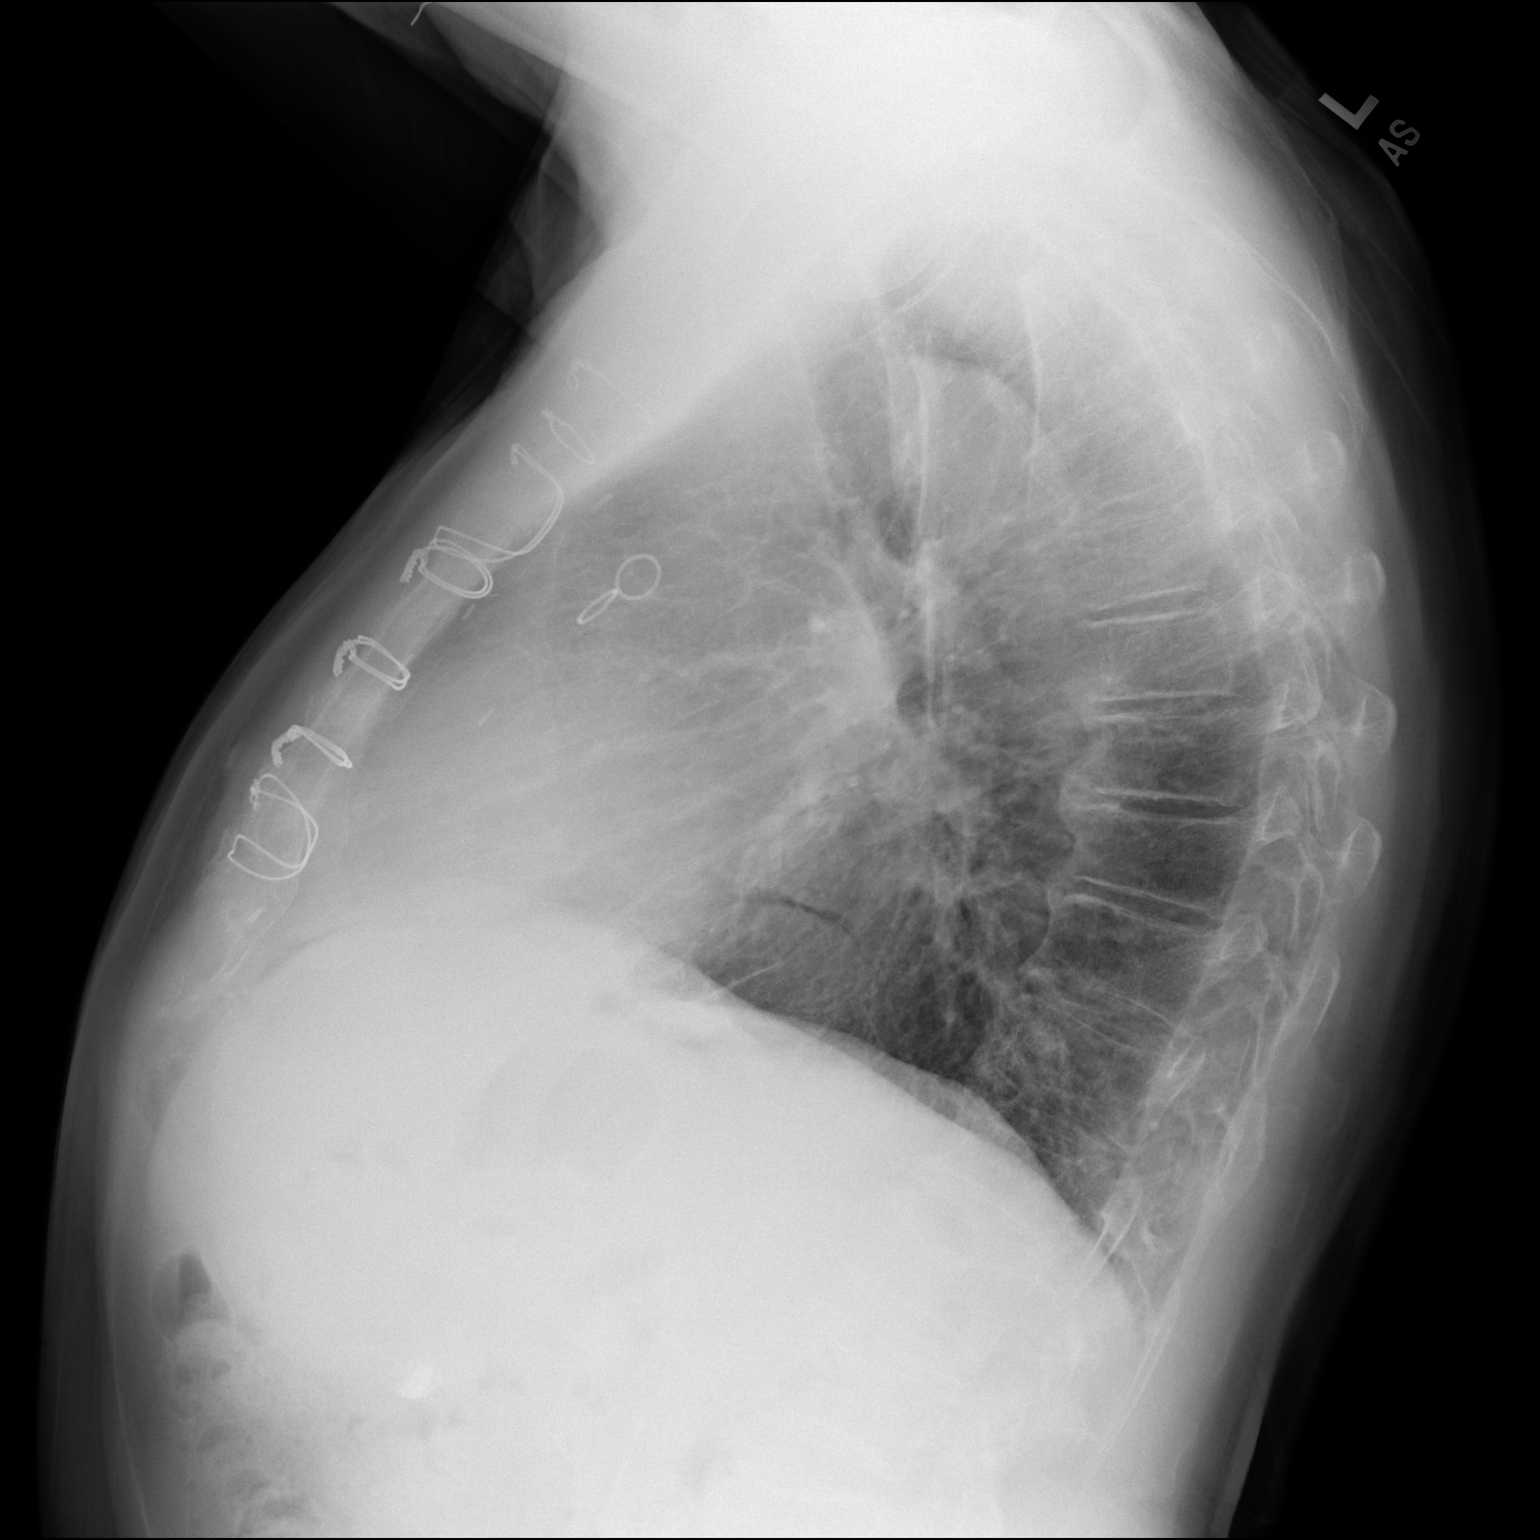

[2 of 2 positions shown; findings below may reference images not displayed]

FINDINGS: The heart size and mediastinal contours are stable. Both lungs are
clear. Degenerative joint changes of the spine are identified.
IMPRESSION: No active cardiopulmonary disease.

## 2020-09-03 ENCOUNTER — Other Ambulatory Visit: Payer: Medicare Other | Admitting: *Deleted

## 2020-09-03 ENCOUNTER — Other Ambulatory Visit: Payer: Self-pay

## 2020-09-03 DIAGNOSIS — Z79899 Other long term (current) drug therapy: Secondary | ICD-10-CM

## 2020-09-03 DIAGNOSIS — E782 Mixed hyperlipidemia: Secondary | ICD-10-CM

## 2020-09-03 LAB — LIPID PANEL
Chol/HDL Ratio: 2.6 ratio (ref 0.0–5.0)
Cholesterol, Total: 243 mg/dL — ABNORMAL HIGH (ref 100–199)
HDL: 93 mg/dL (ref >39)
LDL Chol Calc (NIH): 137 mg/dL — ABNORMAL HIGH (ref 0–99)
Triglycerides: 77 mg/dL (ref 0–149)
VLDL Cholesterol Cal: 13 mg/dL (ref 5–40)

## 2020-09-03 LAB — ALT: ALT: 8 IU/L (ref 0–44)

## 2020-09-04 ENCOUNTER — Telehealth: Payer: Self-pay | Admitting: Nurse Practitioner

## 2020-09-04 DIAGNOSIS — E782 Mixed hyperlipidemia: Secondary | ICD-10-CM

## 2020-09-04 DIAGNOSIS — I2510 Atherosclerotic heart disease of native coronary artery without angina pectoris: Secondary | ICD-10-CM

## 2020-09-04 MED ORDER — EZETIMIBE 10 MG PO TABS: 10.0000 mg | ORAL_TABLET | Freq: Every day | ORAL | 3 refills | Status: DC

## 2020-09-04 NOTE — Telephone Encounter (Signed)
Reviewed results and plan of care with patient who verbalized understanding and agreement to add Zetia 10 mg daily. He is scheduled for repeat lipid panel in August. Advised him to call back at any time with questions or concerns and he thanked me for the call.

## 2020-09-04 NOTE — Telephone Encounter (Signed)
-----   Message from Jerline Pain, MD sent at 09/03/2020  5:34 PM EDT ----- LDL 137 improved from 149. Goal <70 On Crestor 40 Add Zetia 10mg  PO QD  Repeat lipid panel in 3 months.  Candee Furbish, MD

## 2020-09-09 ENCOUNTER — Other Ambulatory Visit: Payer: Self-pay

## 2020-09-09 MED ORDER — EZETIMIBE 10 MG PO TABS: 10.0000 mg | ORAL_TABLET | Freq: Every day | ORAL | 2 refills | Status: DC

## 2020-09-12 ENCOUNTER — Telehealth: Payer: Self-pay | Admitting: Cardiology

## 2020-09-12 MED ORDER — EZETIMIBE 10 MG PO TABS: 10.0000 mg | ORAL_TABLET | Freq: Every day | ORAL | 3 refills | Status: DC

## 2020-09-12 NOTE — Telephone Encounter (Signed)
Sent mychart message to Pt.  Advised would resend ezetimibe to mail order and advise to fill as generic.  Await further needs.

## 2020-09-12 NOTE — Telephone Encounter (Signed)
Patient's wife called to say they switch to mail order and this prescription is costing to much ezetimibe (ZETIA) 10 MG tablet. She is asking to be switch to a generic brand. Please advise

## 2020-10-23 ENCOUNTER — Encounter: Payer: Self-pay | Admitting: Podiatry

## 2020-10-23 ENCOUNTER — Ambulatory Visit (INDEPENDENT_AMBULATORY_CARE_PROVIDER_SITE_OTHER): Payer: Medicare Other

## 2020-10-23 ENCOUNTER — Ambulatory Visit: Payer: Medicare Other | Admitting: Podiatry

## 2020-10-23 ENCOUNTER — Other Ambulatory Visit: Payer: Self-pay

## 2020-10-23 ENCOUNTER — Other Ambulatory Visit: Payer: Self-pay | Admitting: Podiatry

## 2020-10-23 DIAGNOSIS — M7752 Other enthesopathy of left foot: Secondary | ICD-10-CM | POA: Diagnosis not present

## 2020-10-23 DIAGNOSIS — D2371 Other benign neoplasm of skin of right lower limb, including hip: Secondary | ICD-10-CM

## 2020-10-23 DIAGNOSIS — G8929 Other chronic pain: Secondary | ICD-10-CM | POA: Insufficient documentation

## 2020-10-23 DIAGNOSIS — M778 Other enthesopathies, not elsewhere classified: Secondary | ICD-10-CM

## 2020-10-23 DIAGNOSIS — M7751 Other enthesopathy of right foot: Secondary | ICD-10-CM

## 2020-10-23 DIAGNOSIS — D2372 Other benign neoplasm of skin of left lower limb, including hip: Secondary | ICD-10-CM | POA: Diagnosis not present

## 2020-10-23 DIAGNOSIS — R030 Elevated blood-pressure reading, without diagnosis of hypertension: Secondary | ICD-10-CM | POA: Insufficient documentation

## 2020-10-23 DIAGNOSIS — M7750 Other enthesopathy of unspecified foot: Secondary | ICD-10-CM

## 2020-10-23 DIAGNOSIS — G4700 Insomnia, unspecified: Secondary | ICD-10-CM | POA: Insufficient documentation

## 2020-10-23 DIAGNOSIS — R339 Retention of urine, unspecified: Secondary | ICD-10-CM | POA: Insufficient documentation

## 2020-10-23 MED ORDER — DEXAMETHASONE SODIUM PHOSPHATE 120 MG/30ML IJ SOLN
4.0000 mg | Freq: Once | INTRAMUSCULAR | Status: AC
  Administered 2020-10-23: 4 mg via INTRA_ARTICULAR

## 2020-10-26 NOTE — Progress Notes (Signed)
Subjective:  Patient ID: Roger [name], male    DOB: 01-06-1946,  MRN: [id number] HPI Chief Complaint  Patient presents with   Foot Pain    5th MPJ bilateral - aching x 2 years, callused areas, swells, redness, PCP trims calluses sometimes   New Patient (Initial Visit)    75 y.o. male presents with the above complaint.   ROS: Denies fever chills nausea vomiting muscle aches pains calf pain back pain chest pain shortness of breath.  Past Medical History:  Diagnosis Date   Chronic back pain    HLD (hyperlipidemia)    HTN (hypertension)    Past Surgical History:  Procedure Laterality Date   BACK SURGERY     CARDIAC CATHETERIZATION     CORONARY ARTERY BYPASS GRAFT N/A 02/14/2019   Procedure: CORONARY ARTERY BYPASS GRAFTING (CABG) TIMES THREE USING LEFT INTERNAL MAMMARY ARTERY AND ENDOSCOPIC HARVESTING OF RIGHT GREATER SAPHENOUS VEIN;  Surgeon: [name], MD;  Location: [place];  Service: Open Heart Surgery;  Laterality: N/A;   LEFT HEART CATH AND CORONARY ANGIOGRAPHY N/A 02/13/2019   Procedure: LEFT HEART CATH AND CORONARY ANGIOGRAPHY;  Surgeon: Leonie Man, MD;  Location: [place] [facility];  Service: Cardiovascular;  Laterality: N/A;   TEE WITHOUT CARDIOVERSION N/A 02/14/2019   Procedure: TRANSESOPHAGEAL ECHOCARDIOGRAM (TEE);  Surgeon: [name], MD;  Location: [place];  Service: Open Heart Surgery;  Laterality: N/A;    Current Outpatient Medications:    aspirin EC 81 MG tablet, Take 81 mg by mouth daily., Disp: , Rfl:    ezetimibe (ZETIA) 10 MG tablet, Take 1 tablet (10 mg total) by mouth daily., Disp: 90 tablet, Rfl: 3   gabapentin (NEURONTIN) 100 MG capsule, TAKE 1 TO 3 CAPSULES BY MOUTH THREE TIMES DAILY AS NEEDED FOR PAIN, Disp: , Rfl:    metoprolol succinate (TOPROL-XL) 50 MG 24 hr tablet, Take 1 tablet (50 mg total) by mouth daily. Take with or immediately following a meal., Disp: 90 tablet, Rfl: 3   Multiple Vitamin (MULTIVITAMIN WITH MINERALS) TABS  tablet, Take 1 tablet by mouth daily., Disp: , Rfl:    rosuvastatin (CRESTOR) 40 MG tablet, Take 1 tablet (40 mg total) by mouth daily at 6 PM., Disp: 90 tablet, Rfl: 3   zolpidem (AMBIEN) 10 MG tablet, Take 10 mg by mouth at bedtime as needed. , Disp: , Rfl:   Allergies  Allergen Reactions   Hydrocodone-Acetaminophen Itching   Temazepam Itching   Trazodone And Nefazodone Itching   Review of Systems Objective:  There were no vitals filed for this visit.  General: Well developed, nourished, in no acute distress, alert and oriented x3   Dermatological: Skin is warm, dry and supple bilateral. Nails x 10 are well maintained; remaining integument appears unremarkable at this time. There are no open sores, no preulcerative lesions, no rash or signs of infection present.  There is a porokeratotic lesions of the fifth metatarsal head bilateral just beneath the bursitis.  No open lesions or wounds are noted.  Vascular: Dorsalis Pedis artery and Posterior Tibial artery pedal pulses are 2/4 bilateral with immedate capillary fill time. Pedal hair growth present. No varicosities and no lower extremity edema present bilateral.   Neruologic: Grossly intact via light touch bilateral. Vibratory intact via tuning fork bilateral. Protective threshold with Semmes Wienstein monofilament intact to all pedal sites bilateral. Patellar and Achilles deep tendon reflexes 2+ bilateral. No Babinski or clonus noted bilateral.   Musculoskeletal: No gross boney pedal deformities  bilateral. No pain, crepitus, or limitation noted with foot and ankle range of motion bilateral. Muscular strength 5/5 in all groups tested bilateral.  Pain on palpation of fifth metatarsals bilateral.  There is fluctuance beneath the fifth metatarsal head consistent with bursitis.  Gait: Unassisted, Nonantalgic.    Radiographs: Radiographs taken today demonstrate an osseously mature individual significant osteoarthritic changes and hammertoe  deformities.  Moderate osteopenia is noted.  Assessment & Plan:   Assessment: Benign skin lesions bursitis some fifth met head bilateral  Plan: Injection dexamethasone to the bursa today bilateral after sterile Betadine skin prep.  2 mg was injected with local anesthetic.  Tolerated the procedure well.  Debridement of benign skin lesions bilateral.  Follow-up with him as needed.     Chasady Longwell T. [place], [place]

## 2020-12-10 ENCOUNTER — Other Ambulatory Visit: Payer: Medicare Other

## 2020-12-22 ENCOUNTER — Other Ambulatory Visit: Payer: Self-pay

## 2020-12-22 MED ORDER — METOPROLOL SUCCINATE ER 50 MG PO TB24
50.0000 mg | ORAL_TABLET | Freq: Every day | ORAL | 1 refills | Status: DC
Start: 2020-12-22 — End: 2021-05-21

## 2021-02-06 ENCOUNTER — Other Ambulatory Visit: Payer: Self-pay

## 2021-02-06 MED ORDER — ROSUVASTATIN CALCIUM 40 MG PO TABS
40.0000 mg | ORAL_TABLET | Freq: Every day | ORAL | 0 refills | Status: DC
Start: 2021-02-06 — End: 2021-03-25

## 2021-03-24 ENCOUNTER — Other Ambulatory Visit: Payer: Self-pay | Admitting: Cardiology

## 2021-05-08 ENCOUNTER — Ambulatory Visit (INDEPENDENT_AMBULATORY_CARE_PROVIDER_SITE_OTHER): Payer: Medicare Other | Admitting: Cardiology

## 2021-05-08 ENCOUNTER — Encounter: Payer: Self-pay | Admitting: Cardiology

## 2021-05-08 ENCOUNTER — Other Ambulatory Visit: Payer: Self-pay

## 2021-05-08 VITALS — BP 140/80 | HR 66 | Ht 69.0 in | Wt 167.0 lb

## 2021-05-08 DIAGNOSIS — Z79899 Other long term (current) drug therapy: Secondary | ICD-10-CM | POA: Diagnosis not present

## 2021-05-08 DIAGNOSIS — I255 Ischemic cardiomyopathy: Secondary | ICD-10-CM

## 2021-05-08 DIAGNOSIS — I2510 Atherosclerotic heart disease of native coronary artery without angina pectoris: Secondary | ICD-10-CM

## 2021-05-08 DIAGNOSIS — E7800 Pure hypercholesterolemia, unspecified: Secondary | ICD-10-CM

## 2021-05-08 DIAGNOSIS — Z951 Presence of aortocoronary bypass graft: Secondary | ICD-10-CM

## 2021-05-08 DIAGNOSIS — E782 Mixed hyperlipidemia: Secondary | ICD-10-CM

## 2021-05-08 DIAGNOSIS — I10 Essential (primary) hypertension: Secondary | ICD-10-CM

## 2021-05-08 LAB — LIPID PANEL
Chol/HDL Ratio: 2 ratio (ref 0.0–5.0)
Cholesterol, Total: 201 mg/dL — ABNORMAL HIGH (ref 100–199)
HDL: 101 mg/dL (ref >39)
LDL Chol Calc (NIH): 91 mg/dL (ref 0–99)
Triglycerides: 50 mg/dL (ref 0–149)
VLDL Cholesterol Cal: 9 mg/dL (ref 5–40)

## 2021-05-08 LAB — BASIC METABOLIC PANEL
BUN/Creatinine Ratio: 13 (ref 10–24)
BUN: 17 mg/dL (ref 8–27)
CO2: 25 mmol/L (ref 20–29)
Calcium: 9 mg/dL (ref 8.6–10.2)
Chloride: 103 mmol/L (ref 96–106)
Creatinine, Ser: 1.26 mg/dL (ref 0.76–1.27)
Glucose: 100 mg/dL — ABNORMAL HIGH (ref 70–99)
Potassium: 4.2 mmol/L (ref 3.5–5.2)
Sodium: 140 mmol/L (ref 134–144)
eGFR: 60 mL/min/{1.73_m2} (ref >59)

## 2021-05-08 LAB — CBC
Hematocrit: 36.1 % — ABNORMAL LOW (ref 37.5–51.0)
Hemoglobin: 12.4 g/dL — ABNORMAL LOW (ref 13.0–17.7)
MCH: 35 pg — ABNORMAL HIGH (ref 26.6–33.0)
MCHC: 34.3 g/dL (ref 31.5–35.7)
MCV: 102 fL — ABNORMAL HIGH (ref 79–97)
Platelets: 210 10*3/uL (ref 150–450)
RBC: 3.54 x10E6/uL — ABNORMAL LOW (ref 4.14–5.80)
RDW: 12.8 % (ref 11.6–15.4)
WBC: 8 10*3/uL (ref 3.4–10.8)

## 2021-05-08 LAB — ALT: ALT: 11 IU/L (ref 0–44)

## 2021-05-08 NOTE — Patient Instructions (Signed)
Medication Instructions:  The current medical regimen is effective;  continue present plan and medications.  *If you need a refill on your cardiac medications before your next appointment, please call your pharmacy*   Lab Work: Please have blood work today (CBC, BMP, ALT and Lipid)  If you have labs (blood work) drawn today and your tests are completely normal, you will receive your results only by: MyChart Message (if you have MyChart) OR A paper copy in the mail If you have any lab test that is abnormal or we need to change your treatment, we will call you to review the results.  Follow-Up: At [facility], you and your health needs are our priority.  As part of our continuing mission to provide you with exceptional heart care, we have created designated Provider Care Teams.  These Care Teams include your primary Cardiologist (physician) and Advanced Practice Providers (APPs -  Physician Assistants and Nurse Practitioners) who all work together to provide you with the care you need, when you need it.  We recommend signing up for the patient portal called "MyChart".  Sign up information is provided on this After Visit Summary.  MyChart is used to connect with patients for Virtual Visits (Telemedicine).  Patients are able to view lab/test results, encounter notes, upcoming appointments, etc.  Non-urgent messages can be sent to your provider as well.   To learn more about what you can do with MyChart, go to [email or web address].    Your next appointment:   1 year(s)  The format for your next appointment:   In Person  Provider:   Candee Furbish, MD     Thank you for choosing [facility]!!

## 2021-05-08 NOTE — Assessment & Plan Note (Signed)
Continue with Toprol 50 mg.  Unable to take ACE inhibitor or ARB because of prior creatinine.  Checking basic metabolic profile.  Pump function improved up to close to 50% at last check.  Excellent.  No significant symptoms at this point.

## 2021-05-08 NOTE — Assessment & Plan Note (Signed)
Post CABG.  No anginal symptoms.  Continue with current goal-directed medical therapy.  Aspirin.  No bleeding.  Checking CBC.  Metoprolol succinate 50.  Statin.

## 2021-05-08 NOTE — Assessment & Plan Note (Signed)
Aiming for an LDL goal of less than 55.  He is on Crestor 40 as well as Zetia 10 mg.  We will go ahead and check a lipid panel today as well as ALT.  He is not having any myalgias with his medications.  Doing well.

## 2021-05-08 NOTE — Assessment & Plan Note (Signed)
As described above.  Stable.

## 2021-05-08 NOTE — Progress Notes (Signed)
Cardiology Office Note:    Date:  05/08/2021   ID:  Roger [name], DOB 1945-09-26, MRN [id number]  PCP:  Chesley Noon, MD   [facility] Providers Cardiologist:  Candee Furbish, MD     Referring MD: Chesley Noon, MD    History of Present Illness:    Roger [name] is a 76 y.o. male here for follow-up of coronary artery disease, status post CABG 02/14/2019, LIMA to LAD SVG to ramus SVG to first diagonal.  Had brief postop atrial fibrillation took amiodarone for short while.  Converted to sinus rhythm.  Echocardiogram still shows mildly reduced ejection fraction 45 to 50% post bypass in January 2021.  He has not been on ACE inhibitor because of elevated creatinine.  Denies any fevers chills nausea vomiting syncope bleeding.  Past Medical History:  Diagnosis Date   Chronic back pain    HLD (hyperlipidemia)    HTN (hypertension)     Past Surgical History:  Procedure Laterality Date   BACK SURGERY     CARDIAC CATHETERIZATION     CORONARY ARTERY BYPASS GRAFT N/A 02/14/2019   Procedure: CORONARY ARTERY BYPASS GRAFTING (CABG) TIMES THREE USING LEFT INTERNAL MAMMARY ARTERY AND ENDOSCOPIC HARVESTING OF RIGHT GREATER SAPHENOUS VEIN;  Surgeon: [name], MD;  Location: [place];  Service: Open Heart Surgery;  Laterality: N/A;   LEFT HEART CATH AND CORONARY ANGIOGRAPHY N/A 02/13/2019   Procedure: LEFT HEART CATH AND CORONARY ANGIOGRAPHY;  Surgeon: Leonie Man, MD;  Location: [place] [facility];  Service: Cardiovascular;  Laterality: N/A;   TEE WITHOUT CARDIOVERSION N/A 02/14/2019   Procedure: TRANSESOPHAGEAL ECHOCARDIOGRAM (TEE);  Surgeon: [name], MD;  Location: [place];  Service: Open Heart Surgery;  Laterality: N/A;    Current Medications: Current Meds  Medication Sig   aspirin EC 81 MG tablet Take 81 mg by mouth daily.   ezetimibe (ZETIA) 10 MG tablet Take 1 tablet (10 mg total) by mouth daily.   gabapentin (NEURONTIN) 100 MG capsule TAKE 1 TO 3  CAPSULES BY MOUTH THREE TIMES DAILY AS NEEDED FOR PAIN   metoprolol succinate (TOPROL-XL) 50 MG 24 hr tablet Take 1 tablet (50 mg total) by mouth daily. Take with or immediately following a meal. - pt needs to keep upcoming appt in Jan, 2023 for further refills   Multiple Vitamin (MULTIVITAMIN WITH MINERALS) TABS tablet Take 1 tablet by mouth daily.   rosuvastatin (CRESTOR) 40 MG tablet Take 1 tablet (40 mg total) by mouth daily at 6 PM. Please keep upcoming appt in January 2023 with Dr. Marlou Porch before anymore refills. Thank you   zolpidem (AMBIEN) 10 MG tablet Take 10 mg by mouth at bedtime as needed.      Allergies:   Hydrocodone-acetaminophen, Temazepam, and Trazodone and nefazodone   Social History   Socioeconomic History   Marital status: Married    Spouse name: Not on file   Number of children: Not on file   Years of education: Not on file   Highest education level: Not on file  Occupational History   Not on file  Tobacco Use   Smoking status: Never   Smokeless tobacco: Never  Vaping Use   Vaping Use: Never used  Substance and Sexual Activity   Alcohol use: No   Drug use: Never   Sexual activity: Not on file  Other Topics Concern   Not on file  Social History Narrative   Not on file   Social Determinants of  Health   Financial Resource Strain: Not on file  Food Insecurity: Not on file  Transportation Needs: Not on file  Physical Activity: Not on file  Stress: Not on file  Social Connections: Not on file     Family History: The patient's family history includes Heart disease in his brother and father.  ROS:   Please see the history of present illness.     All other systems reviewed and are negative.  EKGs/Labs/Other Studies Reviewed:    The following studies were reviewed today: Echocardiogram 05/07/2019: -EF 45 to 50%.  Mild hypokinesis of the mid apical anterior wall.  Grade 1 diastolic dysfunction.  Trivial AI.  Moderately dilated left atrium, mild MR.    catheterization 02/13/2019: Diagnostic Dominance: Left      EKG:  EKG is  ordered today.  The ekg ordered today demonstrates sinus rhythm 66 with premature atrial contraction  Recent Labs: 06/02/2020: BUN 17; Creatinine, Ser 1.36; Hemoglobin 14.2; Platelets 239; Potassium 4.4; Sodium 142 09/03/2020: ALT 8  Recent Lipid Panel    Component Value Date/Time   CHOL 243 (H) 09/03/2020 0755   TRIG 77 09/03/2020 0755   HDL 93 09/03/2020 0755   CHOLHDL 2.6 09/03/2020 0755   LDLCALC 137 (H) 09/03/2020 0755     Risk Assessment/Calculations:              Physical Exam:    VS:  BP 140/80 (BP Location: Left Arm, Patient Position: Sitting, Cuff Size: Normal)    Pulse 66    Ht 5\' 9"  (1.753 m)    Wt 167 lb (75.8 kg)    SpO2 96%    BMI 24.66 kg/m     Wt Readings from Last 3 Encounters:  05/08/21 167 lb (75.8 kg)  05/28/20 195 lb (88.5 kg)  09/03/19 77 lb (34.9 kg)     GEN:  Well nourished, well developed in no acute distress HEENT: Normal NECK: No JVD; No carotid bruits LYMPHATICS: No lymphadenopathy CARDIAC: CABG scar, RRR, no murmurs, no rubs, gallops RESPIRATORY:  Clear to auscultation without rales, wheezing or rhonchi  ABDOMEN: Soft, non-tender, non-distended MUSCULOSKELETAL:  No edema; No deformity  SKIN: Warm and dry NEUROLOGIC:  Alert and oriented x 3 PSYCHIATRIC:  Normal affect   ASSESSMENT:    1. Coronary artery disease involving native coronary artery of native heart without angina pectoris   2. Medication management   3. Mixed hyperlipidemia   4. Ischemic cardiomyopathy   5. Essential hypertension   6. Pure hypercholesterolemia   7. S/P CABG x 3    PLAN:    In order of problems listed above:  Hyperlipidemia Aiming for an LDL goal of less than 55.  He is on Crestor 40 as well as Zetia 10 mg.  We will go ahead and check a lipid panel today as well as ALT.  He is not having any myalgias with his medications.  Doing well.  Coronary artery disease involving  native coronary artery of native heart without angina pectoris Post CABG.  No anginal symptoms.  Continue with current goal-directed medical therapy.  Aspirin.  No bleeding.  Checking CBC.  Metoprolol succinate 50.  Statin.  S/P CABG x 3 As described above.  Stable.  Ischemic cardiomyopathy Continue with Toprol 50 mg.  Unable to take ACE inhibitor or ARB because of prior creatinine.  Checking basic metabolic profile.  Pump function improved up to close to 50% at last check.  Excellent.  No significant symptoms at this point.  Medication Adjustments/Labs and Tests Ordered: Current medicines are reviewed at length with the patient today.  Concerns regarding medicines are outlined above.  Orders Placed This Encounter  Procedures   Lipid panel   Basic metabolic panel   ALT   CBC   EKG 12-Lead   No orders of the defined types were placed in this encounter.   Patient Instructions  Medication Instructions:  The current medical regimen is effective;  continue present plan and medications.  *If you need a refill on your cardiac medications before your next appointment, please call your pharmacy*   Lab Work: Please have blood work today (CBC, BMP, ALT and Lipid)  If you have labs (blood work) drawn today and your tests are completely normal, you will receive your results only by: MyChart Message (if you have MyChart) OR A paper copy in the mail If you have any lab test that is abnormal or we need to change your treatment, we will call you to review the results.  Follow-Up: At [facility], you and your health needs are our priority.  As part of our continuing mission to provide you with exceptional heart care, we have created designated Provider Care Teams.  These Care Teams include your primary Cardiologist (physician) and Advanced Practice Providers (APPs -  Physician Assistants and Nurse Practitioners) who all work together to provide you with the care you need, when you need  it.  We recommend signing up for the patient portal called "MyChart".  Sign up information is provided on this After Visit Summary.  MyChart is used to connect with patients for Virtual Visits (Telemedicine).  Patients are able to view lab/test results, encounter notes, upcoming appointments, etc.  Non-urgent messages can be sent to your provider as well.   To learn more about what you can do with MyChart, go to [email or web address].    Your next appointment:   1 year(s)  The format for your next appointment:   In Person  Provider:   Candee Furbish, MD     Thank you for choosing [facility]!!      Signed, Candee Furbish, MD  05/08/2021 10:03 AM    [place]

## 2021-05-11 ENCOUNTER — Telehealth: Payer: Self-pay | Admitting: *Deleted

## 2021-05-11 DIAGNOSIS — E782 Mixed hyperlipidemia: Secondary | ICD-10-CM

## 2021-05-11 NOTE — Telephone Encounter (Signed)
Improved LDL from 149 down to 91 with Crestor 40, Zetia 10.   I would still like goal LDL to be less than 55 if possible given his prior CABG.  Lets have him sit down with our lipid clinic to discuss PCSK9 inhibitor.   Candee Furbish, MD    Attempted to call - NA and no VM.

## 2021-05-12 NOTE — Telephone Encounter (Signed)
Spoke with patient who states understanding of Lipids and referral to the lipid clinic.  Pt aware order will be placed and he will be contacted to be scheduled.

## 2021-05-21 ENCOUNTER — Other Ambulatory Visit: Payer: Self-pay | Admitting: Cardiology

## 2021-05-28 NOTE — Progress Notes (Signed)
Patient ID: Roger [name]                 DOB: [birth year]                    MRN: [id number]     HPI: Roger [name] is a 76 y.o. male patient referred to lipid clinic by Dr. Marlou Porch. PMH is significant for CAD (s/p CABG x3 02/14/19), unstable angina, HLD, HTN, ischemic cardiomyopathy, and chronic back pain. Echo in 05/2019 revealed an LVEF of 45-50% and G1DD. After CABG in 02/2019, pt was discharged from hospital on rosuvastatin 40mg  daily. In 08/2020, LDL was still elevated at 137 on high intensity stain, so Zetia 10mg  daily was added to his regimen. At last visit with Dr. Marlou Porch on 05/08/21, pt denied any muscle pain with medications and was doing well on rosuvastatin 40mg  daily and Zetia 10mg  daily. His lipid panel revealed a lower LDL (149 to 91 mg/dL). As pt was still above goal of <55, he was referred to lipid clinic to discuss PCSK9 inhibitor therapy.  Pt presents to clinic in good spirits. He has been adherent to his medications and has had no problems on rosuvastatin and Zetia. He states that he does not eat a very healthy diet, and eats lots of soups and hot dogs, but walks about a mile a day with his dog. He is amendable to starting another medication and is willing to try a PCSK9 Inhibitor. He states he is able to afford a ~$45/month copay as well.  Current Medications: rosuvastatin 40mg  daily (PM), Zetia 10mg  daily Risk Factors: CAD s/p CABG, unstable angina, HTN, age LDL goal: <55  Diet: hot dog, soup, apples   Exercise: mile a day with a dog   Family History: Heart disease in his brother and father   Social History: never smoker, never alcohol   Labs:  05/08/21: TC 201, TG 50, HDL 101, LDL 91 (rosuvastatin 40mg  daily and Zetia 10mg  daily) 09/03/20: TC 243, TG 77, HDL 93, LDL 137 (rosuvastatin 40mg  daily)  Past Medical History:  Diagnosis Date   Chronic back pain    HLD (hyperlipidemia)    HTN (hypertension)     Current Outpatient Medications on File Prior to Visit   Medication Sig Dispense Refill   aspirin EC 81 MG tablet Take 81 mg by mouth daily.     ezetimibe (ZETIA) 10 MG tablet Take 1 tablet (10 mg total) by mouth daily. 90 tablet 3   gabapentin (NEURONTIN) 100 MG capsule TAKE 1 TO 3 CAPSULES BY MOUTH THREE TIMES DAILY AS NEEDED FOR PAIN     metoprolol succinate (TOPROL-XL) 50 MG 24 hr tablet TAKE 1 TABLET BY MOUTH  DAILY WITH OR IMMEDIATELY  FOLLOWING A MEAL 90 tablet 3   Multiple Vitamin (MULTIVITAMIN WITH MINERALS) TABS tablet Take 1 tablet by mouth daily.     rosuvastatin (CRESTOR) 40 MG tablet Take 1 tablet (40 mg total) by mouth daily at 6 PM. Please keep upcoming appt in January 2023 with Dr. Marlou Porch before anymore refills. Thank you 90 tablet 0   zolpidem (AMBIEN) 10 MG tablet Take 10 mg by mouth at bedtime as needed.      No current facility-administered medications on file prior to visit.    Allergies  Allergen Reactions   Hydrocodone-Acetaminophen Itching   Temazepam Itching   Trazodone And Nefazodone Itching    Assessment/Plan:  1. Hyperlipidemia - LDL is elevated at 91 above goal of <55 mg/dL  on high intensity statin and Zetia. Will initiate Repatha 140 mg/mL  q2weeks and continue rosuvastatin 40mg  daily and Zetia 10mg  daily. PA approved and Rx sent to local pharmacy. Pt aware to call with any copay concerns. Encouraged patient to eat a heart healthy diet and exercise, as able. F/u labs scheduled to reassess lipid response to therapy.  Jethro Poling, PharmD Student assisted in this visit.  Megan E. Supple, PharmD, BCACP, [place] [address], [place], [place] [address] Phone: [phone]; Fax: [phone] 05/29/2021 9:00 AM

## 2021-05-28 NOTE — Patient Instructions (Addendum)
It was nice to meet you today!  Your LDL cholesterol is 91 and your goal is <55.  Continue rosuvastatin 40mg  daily and Zetia 10mg  daily.  I will submit information to your insurance for Repatha (a PCSK9 Inhibitor) and let you know when I hear back.    Repatha is a subcutaneous injection given once every 2 weeks in the fatty tissue of your stomach or upper outer thigh. Store the medication in the fridge. You can let your dose warm up to room temperature for 30 minutes before injecting if you prefer. Repatha will lower your LDL cholesterol by 60% and helps to lower your chance of having a heart attack or stroke.  Continue to eat a heart healthy diet and increase your physical activity.  We will have you come back for labs on March 31st, 2023 anytime after 7:30 AM fasting.

## 2021-05-29 ENCOUNTER — Ambulatory Visit (INDEPENDENT_AMBULATORY_CARE_PROVIDER_SITE_OTHER): Payer: Medicare Other | Admitting: Pharmacist

## 2021-05-29 ENCOUNTER — Encounter: Payer: Self-pay | Admitting: Pharmacist

## 2021-05-29 ENCOUNTER — Other Ambulatory Visit: Payer: Self-pay

## 2021-05-29 DIAGNOSIS — E7800 Pure hypercholesterolemia, unspecified: Secondary | ICD-10-CM | POA: Diagnosis not present

## 2021-05-29 LAB — LIPID PANEL
Chol/HDL Ratio: 2 ratio (ref 0.0–5.0)
Cholesterol, Total: 187 mg/dL (ref 100–199)
HDL: 93 mg/dL (ref >39)
LDL Chol Calc (NIH): 80 mg/dL (ref 0–99)
Triglycerides: 78 mg/dL (ref 0–149)
VLDL Cholesterol Cal: 14 mg/dL (ref 5–40)

## 2021-05-29 LAB — HEPATIC FUNCTION PANEL
ALT: 8 IU/L (ref 0–44)
AST: 17 IU/L (ref 0–40)
Albumin: 4.3 g/dL (ref 3.7–4.7)
Alkaline Phosphatase: 99 IU/L (ref 44–121)
Bilirubin Total: 0.5 mg/dL (ref 0.0–1.2)
Bilirubin, Direct: 0.19 mg/dL (ref 0.00–0.40)
Total Protein: 7.2 g/dL (ref 6.0–8.5)

## 2021-05-29 MED ORDER — REPATHA SURECLICK 140 MG/ML ~~LOC~~ SOAJ: 1.0000 "pen " | SUBCUTANEOUS | 11 refills | Status: DC

## 2021-07-31 ENCOUNTER — Other Ambulatory Visit: Payer: Medicare Other

## 2021-10-22 DIAGNOSIS — R339 Retention of urine, unspecified: Secondary | ICD-10-CM | POA: Insufficient documentation

## 2021-10-22 DIAGNOSIS — N480 Leukoplakia of penis: Secondary | ICD-10-CM | POA: Insufficient documentation

## 2021-10-22 DIAGNOSIS — N99110 Postprocedural urethral stricture, male, meatal: Secondary | ICD-10-CM | POA: Insufficient documentation

## 2021-11-04 ENCOUNTER — Other Ambulatory Visit: Payer: Self-pay | Admitting: Cardiology

## 2021-12-17 ENCOUNTER — Other Ambulatory Visit: Payer: Self-pay | Admitting: Cardiology

## 2022-02-25 ENCOUNTER — Other Ambulatory Visit: Payer: Self-pay | Admitting: Cardiology

## 2022-05-13 ENCOUNTER — Other Ambulatory Visit: Payer: Self-pay | Admitting: Cardiology

## 2022-05-14 DIAGNOSIS — L98491 Non-pressure chronic ulcer of skin of other sites limited to breakdown of skin: Secondary | ICD-10-CM | POA: Insufficient documentation

## 2022-06-23 ENCOUNTER — Other Ambulatory Visit: Payer: Self-pay | Admitting: Cardiology

## 2022-09-09 ENCOUNTER — Other Ambulatory Visit: Payer: Self-pay | Admitting: Cardiology

## 2022-09-16 ENCOUNTER — Ambulatory Visit: Payer: Medicare Other | Admitting: Cardiology

## 2022-11-03 ENCOUNTER — Other Ambulatory Visit: Payer: Self-pay | Admitting: Cardiology

## 2022-12-22 ENCOUNTER — Other Ambulatory Visit: Payer: Self-pay | Admitting: Cardiology

## 2023-01-11 ENCOUNTER — Other Ambulatory Visit: Payer: Self-pay | Admitting: Cardiology

## 2023-02-04 ENCOUNTER — Encounter: Payer: Self-pay | Admitting: Cardiology

## 2023-02-04 ENCOUNTER — Ambulatory Visit: Payer: Medicare Other | Attending: Cardiology | Admitting: Cardiology

## 2023-02-04 VITALS — BP 178/76 | HR 73 | Ht 69.0 in | Wt 162.8 lb

## 2023-02-04 DIAGNOSIS — I10 Essential (primary) hypertension: Secondary | ICD-10-CM | POA: Diagnosis not present

## 2023-02-04 DIAGNOSIS — E7800 Pure hypercholesterolemia, unspecified: Secondary | ICD-10-CM | POA: Diagnosis not present

## 2023-02-04 DIAGNOSIS — Z951 Presence of aortocoronary bypass graft: Secondary | ICD-10-CM

## 2023-02-04 DIAGNOSIS — E782 Mixed hyperlipidemia: Secondary | ICD-10-CM

## 2023-02-04 NOTE — Progress Notes (Signed)
Cardiology Office Note:  .   Date:  02/04/2023  ID:  Roger [name], DOB Nov 07, 1945, MRN [id number] PCP: Eartha Inch, MD  [place] [facility] Providers Cardiologist:  Donato Schultz, MD     History of Present Illness: .   Roger [name] is a 77 y.o. male Discussed with the use of AI scribe   History of Present Illness   Roger [name], a patient with a history of coronary artery disease status post bypass surgery four years ago, presents for a routine follow-up. He reports occasional chest discomfort, described as "little twitches," but deny any significant or frequent episodes of chest pain or shortness of breath.  He is currently on a regimen of Zetia 10mg , Crestor 40mg , and Toprol 50mg , which he has been tolerating well. His cholesterol level was last recorded as 80 a year ago. He had previously considered an injectable medication, possibly Repatha, but decided against it due to cost concerns.  Roger [name] also has a history of atrial fibrillation post-bypass surgery, which has since resolved after discontinuation of amiodarone. His cardiac function is reported as only minimally reduced, with an ejection fraction of 50% in 2021. He also take a daily baby aspirin (81mg ).  His kidney function is slightly impaired, with a creatinine level of 1.26.           Studies Reviewed: Marland Kitchen   EKG Interpretation Date/Time:  Friday February 04 2023 09:14:14 EDT Ventricular Rate:  73 PR Interval:  134 QRS Duration:  86 QT Interval:  412 QTC Calculation: 453 R Axis:   38  Text Interpretation: Normal sinus rhythm Minimal voltage criteria for LVH, may be normal variant ( R in aVL ) T wave abnormality, consider inferior ischemia When compared with ECG of 17-Feb-2019 06:49, Premature atrial complexes are no longer Present ST now depressed in Inferior leads ST no longer elevated in Anterolateral leads T wave inversion now evident in Inferior leads Confirmed by Donato Schultz ([id number]) on 02/04/2023 9:43:23 AM      Risk Assessment/Calculations:           Physical Exam:   VS:  BP (!) 178/76   Pulse 73   Ht 5\' 9"  (1.753 m)   Wt 162 lb 12.8 oz (73.8 kg)   SpO2 97%   BMI 24.04 kg/m    Wt Readings from Last 3 Encounters:  02/04/23 162 lb 12.8 oz (73.8 kg)  05/08/21 167 lb (75.8 kg)  05/28/20 195 lb (88.5 kg)    GEN: Well nourished, well developed in no acute distress NECK: No JVD; No carotid bruits CARDIAC: CABG scar, RRR, no murmurs, no rubs, no gallops RESPIRATORY:  Clear to auscultation without rales, wheezing or rhonchi  ABDOMEN: Soft, non-tender, non-distended EXTREMITIES:  No edema; No deformity   ASSESSMENT AND PLAN: .    Assessment and Plan    Coronary Artery Disease status post bypass surgery Occasional chest discomfort, but overall stable. Ejection fraction 50% in 2021. -Continue current medications including Zetia 10mg , Crestor 40mg , Toprol 50mg , and Aspirin 81mg  daily. -Plan for annual follow-up.  Hyperlipidemia Last cholesterol level was 80 a year ago. Previous attempt to use Repatha was unsuccessful due to cost. -Continue current lipid-lowering therapy with Zetia 10mg  and Crestor 40mg .  Chronic Kidney Disease Creatinine 1.26, indicating mild renal impairment. -No changes to current management.  Atrial Fibrillation status post bypass surgery Brief episode of atrial fibrillation post-bypass, but currently off amiodarone and doing well. -No changes to current management.  General Health Maintenance -Continue current medications  and lifestyle modifications. -Plan for annual follow-up.               Signed, Donato Schultz, MD

## 2023-02-04 NOTE — Patient Instructions (Signed)

## 2023-02-16 ENCOUNTER — Other Ambulatory Visit: Payer: Self-pay | Admitting: Cardiology

## 2023-03-01 ENCOUNTER — Other Ambulatory Visit: Payer: Self-pay | Admitting: Cardiology

## 2023-03-22 ENCOUNTER — Other Ambulatory Visit: Payer: Self-pay | Admitting: Cardiology

## 2023-06-09 DIAGNOSIS — N400 Enlarged prostate without lower urinary tract symptoms: Secondary | ICD-10-CM | POA: Insufficient documentation

## 2023-06-09 DIAGNOSIS — G629 Polyneuropathy, unspecified: Secondary | ICD-10-CM | POA: Insufficient documentation

## 2023-08-24 ENCOUNTER — Other Ambulatory Visit: Payer: Self-pay | Admitting: Cardiology

## 2023-11-22 ENCOUNTER — Other Ambulatory Visit: Payer: Self-pay | Admitting: Cardiology

## 2023-12-06 ENCOUNTER — Ambulatory Visit (INDEPENDENT_AMBULATORY_CARE_PROVIDER_SITE_OTHER): Admitting: Family Medicine

## 2023-12-06 ENCOUNTER — Encounter (HOSPITAL_BASED_OUTPATIENT_CLINIC_OR_DEPARTMENT_OTHER): Payer: Self-pay | Admitting: Family Medicine

## 2023-12-06 VITALS — BP 176/92 | HR 70 | Ht 69.0 in | Wt 159.9 lb

## 2023-12-06 DIAGNOSIS — I10 Essential (primary) hypertension: Secondary | ICD-10-CM | POA: Insufficient documentation

## 2023-12-06 DIAGNOSIS — E7800 Pure hypercholesterolemia, unspecified: Secondary | ICD-10-CM

## 2023-12-06 DIAGNOSIS — G4700 Insomnia, unspecified: Secondary | ICD-10-CM | POA: Diagnosis not present

## 2023-12-06 NOTE — Progress Notes (Signed)
 New Patient Office Visit  Subjective   Patient ID: Roger [name], male    DOB: July 28, 1945  Age: 78 y.o. MRN: [id number]  CC:  Chief Complaint  Patient presents with   New Patient (Initial Visit)    New Patient was seeing Dr Sophronia no issues just establishing care     HPI Roger [name] presents to establish care Last PCP - Dr. Sophronia  Patient does follow with cardiology related to history of hypertension, CAD status post bypass surgery, hyperlipidemia.  He is currently taking ASA 81, ezetimibe , metoprolol , rosuvastatin .  Denies any issues with medication.  Does not need refill today.  Patient also reports history of insomnia, does have zolpidem  available that he utilizes very infrequently.  Reports that he will take this maybe 3 times per year.  Patient also has prescription for gabapentin, although he is somewhat unsure what this medication is taken for.  It does appear that there is a diagnosis of neuropathy on the chart.  He presently denies any issues with numbness, tingling, burning sensation in feet or hands.  Patient is originally from [place]. He enjoys fishing, but hasn't done so in a while.  Outpatient Encounter Medications as of 12/06/2023  Medication Sig   aspirin  EC 81 MG tablet Take 81 mg by mouth daily.   ezetimibe  (ZETIA ) 10 MG tablet TAKE 1 TABLET BY MOUTH DAILY   gabapentin (NEURONTIN) 100 MG capsule TAKE 1 TO 3 CAPSULES BY MOUTH THREE TIMES DAILY AS NEEDED FOR PAIN   metoprolol  succinate (TOPROL -XL) 50 MG 24 hr tablet TAKE 1 TABLET BY MOUTH DAILY  WITH OR IMMEDIATELY FOLLOWING A  MEAL. PLEASE KEEP SCHEDULED  APPOINTMENT FOR FUTURE REFILLS.  THANK YOU   Multiple Vitamin (MULTIVITAMIN WITH MINERALS) TABS tablet Take 1 tablet by mouth daily.   rosuvastatin  (CRESTOR ) 40 MG tablet TAKE 1 TABLET BY MOUTH DAILY AT  6 PM.   zolpidem  (AMBIEN ) 10 MG tablet Take 10 mg by mouth at bedtime as needed.    No facility-administered encounter medications on file as of  12/06/2023.    Past Medical History:  Diagnosis Date   Chronic back pain    HLD (hyperlipidemia)    HTN (hypertension)     Past Surgical History:  Procedure Laterality Date   BACK SURGERY     CARDIAC CATHETERIZATION     CORONARY ARTERY BYPASS GRAFT N/A 02/14/2019   Procedure: CORONARY ARTERY BYPASS GRAFTING (CABG) TIMES THREE USING LEFT INTERNAL MAMMARY ARTERY AND ENDOSCOPIC HARVESTING OF RIGHT GREATER SAPHENOUS VEIN;  Surgeon: German Bartlett PEDLAR, MD;  Location: [facility] OR;  Service: Open Heart Surgery;  Laterality: N/A;   LEFT HEART CATH AND CORONARY ANGIOGRAPHY N/A 02/13/2019   Procedure: LEFT HEART CATH AND CORONARY ANGIOGRAPHY;  Surgeon: Anner Alm ORN, MD;  Location: [facility] INVASIVE CV LAB;  Service: Cardiovascular;  Laterality: N/A;   TEE WITHOUT CARDIOVERSION N/A 02/14/2019   Procedure: TRANSESOPHAGEAL ECHOCARDIOGRAM (TEE);  Surgeon: German Bartlett PEDLAR, MD;  Location: [facility] OR;  Service: Open Heart Surgery;  Laterality: N/A;    Family History  Problem Relation Age of Onset   Heart disease Father    Heart disease Brother     Social History   Socioeconomic History   Marital status: Married    Spouse name: Not on file   Number of children: Not on file   Years of education: Not on file   Highest education level: Not on file  Occupational History   Not on file  Tobacco Use  Smoking status: Never    Passive exposure: Never   Smokeless tobacco: Never  Vaping Use   Vaping status: Never Used  Substance and Sexual Activity   Alcohol use: No   Drug use: Never   Sexual activity: Not on file  Other Topics Concern   Not on file  Social History Narrative   Not on file   Social Drivers of Health   Financial Resource Strain: Low Risk  (06/09/2023)   [facility]   Overall Financial Resource Strain (CARDIA)    Difficulty of Paying Living Expenses: Not hard at all  Food Insecurity: No Food Insecurity (06/09/2023)   Received from [facility]   Hunger Vital Sign     Within the past 12 months, you worried that your food would run out before you got the money to buy more.: Never true    Within the past 12 months, the food you bought just didn't last and you didn't have money to get more.: Never true  Transportation Needs: No Transportation Needs (06/09/2023)   Received from [facility] - Transportation    Lack of Transportation (Medical): No    Lack of Transportation (Non-Medical): No  Physical Activity: Unknown (11/25/2022)   Received from [facility]   Exercise Vital Sign    On average, how many days per week do you engage in moderate to strenuous exercise (like a brisk walk)?: 0 days    Minutes of Exercise per Session: Not on file  Stress: No Stress Concern Present (11/25/2022)   Received from [facility] - Occupational Stress Questionnaire    Feeling of Stress : Not at all  Social Connections: Socially Integrated (11/25/2022)   Received from [facility]   Social Network    How would you rate your social network (family, work, friends)?: Good participation with social networks  Intimate Partner Violence: Not At Risk (11/25/2022)   Received from [facility]   HITS    Over the last 12 months how often did your partner physically hurt you?: Never    Over the last 12 months how often did your partner insult you or talk down to you?: Never    Over the last 12 months how often did your partner threaten you with physical harm?: Never    Over the last 12 months how often did your partner scream or curse at you?: Never    Objective   BP (!) 176/92 (BP Location: Left Arm, Patient Position: Sitting, Cuff Size: Normal)   Pulse 70   Ht 5' 9 (1.753 m)   Wt 159 lb 14.4 oz (72.5 kg)   SpO2 100%   BMI 23.61 kg/m   Physical Exam  78 year old male in no acute distress Cardiovascular exam with regular rate and rhythm Lungs clear to auscultation bilaterally  Assessment & Plan:   Pure  hypercholesterolemia Assessment & Plan: Can continue with current medication regimen, not currently having any side effects or issues with medications.   Insomnia, unspecified type Assessment & Plan: Currently with infrequent use of zolpidem , can allow for continuing with this medication.  Will need to utilize lower dose given patient age.   Primary hypertension Assessment & Plan: Blood pressure is elevated in office today, remains elevated on recheck, does have follow-up with cardiology upcoming.  Advised on monitoring blood pressure intermittently at home, DASH diet.  May need to consider adjusting medication regimen if blood pressures continue to be elevated  Return in about 4 months (around 04/06/2024) for hypertension.    ___________________________________________ Reesa Gotschall de Peru, MD, ABFM, CAQSM Primary Care and Sports Medicine [facility]

## 2023-12-06 NOTE — Assessment & Plan Note (Signed)
 Blood pressure is elevated in office today, remains elevated on recheck, does have follow-up with cardiology upcoming.  Advised on monitoring blood pressure intermittently at home, DASH diet.  May need to consider adjusting medication regimen if blood pressures continue to be elevated

## 2023-12-06 NOTE — Assessment & Plan Note (Signed)
 Currently with infrequent use of zolpidem , can allow for continuing with this medication.  Will need to utilize lower dose given patient age.

## 2023-12-06 NOTE — Patient Instructions (Signed)

## 2023-12-06 NOTE — Assessment & Plan Note (Signed)
 Can continue with current medication regimen, not currently having any side effects or issues with medications.

## 2023-12-28 ENCOUNTER — Other Ambulatory Visit: Payer: Self-pay | Admitting: Cardiology

## 2023-12-29 ENCOUNTER — Encounter: Payer: Self-pay | Admitting: Cardiology

## 2023-12-30 MED ORDER — METOPROLOL SUCCINATE ER 50 MG PO TB24: ORAL_TABLET | ORAL | 1 refills | Status: DC

## 2024-01-06 ENCOUNTER — Other Ambulatory Visit (HOSPITAL_BASED_OUTPATIENT_CLINIC_OR_DEPARTMENT_OTHER): Payer: Self-pay

## 2024-01-06 MED ORDER — FLUZONE HIGH-DOSE 0.5 ML IM SUSY
0.5000 mL | PREFILLED_SYRINGE | Freq: Once | INTRAMUSCULAR | 0 refills | Status: AC
  Filled 2024-01-06: qty 0.5, 1d supply, fill #0

## 2024-01-09 ENCOUNTER — Other Ambulatory Visit (HOSPITAL_BASED_OUTPATIENT_CLINIC_OR_DEPARTMENT_OTHER): Payer: Self-pay

## 2024-01-09 ENCOUNTER — Ambulatory Visit: Payer: Self-pay

## 2024-01-09 NOTE — Telephone Encounter (Signed)
 FYI Only or Action Required?: FYI only for provider.  Patient was last seen in primary care on 12/06/2023 by de Peru, Quintin PARAS, MD.  Called Nurse Triage reporting urine problem.  Symptoms began several days ago.  Interventions attempted: Nothing.  Symptoms are: stable.  Triage Disposition: See PCP Within 2 Weeks  Patient/caregiver understands and will follow disposition?: Yes      Copied from CRM [id number]. Topic: Clinical - Red Word Triage >> Jan 09, 2024 10:27 AM DeAngela L wrote: Red Word that prompted transfer to Nurse Triage: patient having a bladder infection concerns since last week, patient states he used to have bladder infections on a regular so patient used to take urine samples to his previous provider   Pt num [phone] (H) Reason for Disposition  All other urine symptoms  Answer Assessment - Initial Assessment Questions Patient states he has a hx of urinary tract infections. Patient denies urinary frequency or any other symptoms.    1. SYMPTOM: What's the main symptom you're concerned about? (e.g., frequency, incontinence)     Odor to urine  2. ONSET: When did the  Change to urine odor  start?     Thursday  3. PAIN: Is there any pain? If Yes, ask: How bad is it? (Scale: 1-10; mild, moderate, severe)     Denies pain  4. CAUSE: What do you think is causing the symptoms?     Possible UTI  5. OTHER SYMPTOMS: Do you have any other symptoms? (e.g., blood in urine, fever, flank pain, pain with urination)     Denies fever, blood in urine  Protocols used: Urinary Symptoms-A-AH

## 2024-01-10 NOTE — Telephone Encounter (Signed)
 Scheduled for 9/10 with de peru as a work in

## 2024-01-11 ENCOUNTER — Ambulatory Visit (HOSPITAL_BASED_OUTPATIENT_CLINIC_OR_DEPARTMENT_OTHER): Admitting: Family Medicine

## 2024-01-11 ENCOUNTER — Encounter (HOSPITAL_BASED_OUTPATIENT_CLINIC_OR_DEPARTMENT_OTHER): Payer: Self-pay | Admitting: Family Medicine

## 2024-01-11 VITALS — BP 148/94 | HR 75 | Ht 68.0 in | Wt 160.1 lb

## 2024-01-11 DIAGNOSIS — R350 Frequency of micturition: Secondary | ICD-10-CM | POA: Diagnosis not present

## 2024-01-11 LAB — POCT URINALYSIS DIP (CLINITEK)
Bilirubin, UA: NEGATIVE
Glucose, UA: NEGATIVE mg/dL
Ketones, POC UA: NEGATIVE mg/dL
Nitrite, UA: NEGATIVE
Spec Grav, UA: 1.01 (ref 1.010–1.025)
Urobilinogen, UA: 0.2 U/dL
pH, UA: 7 (ref 5.0–8.0)

## 2024-01-11 NOTE — Progress Notes (Signed)
    Procedures performed today:    None.  Independent interpretation of notes and tests performed by another provider:   None.  Brief History, Exam, Impression, and Recommendations:    BP (!) 148/94 (BP Location: Left Arm, Patient Position: Sitting, Cuff Size: Normal)   Pulse 75   Ht 5' 8 (1.727 m)   Wt 160 lb 1.6 oz (72.6 kg)   SpO2 100%   BMI 24.34 kg/m   Discussed the use of AI scribe software for clinical note transcription with the patient, who gave verbal consent to proceed.  History of Present Illness Roger [name] is a 78 year old male who presents with urinary symptoms.  He has been experiencing urinary symptoms for approximately five years, characterized by increased frequency of urination, particularly nocturia. There is no associated dysuria, except when self-catheterizing.  He recalls seeing a urologist about three years ago and was advised to continue his current management. About a year ago, he was prescribed an antibiotic by his primary care doctor, which he took for a short course and experienced improvement in symptoms. He does not recall the specific symptoms at that time but indicates they were similar to his current symptoms.  No abdominal pain is reported.  No pain or burning with urination, abdominal pain, and reports increased frequency of urination at night.  Urinary frequency Assessment & Plan: Increased urination frequency, especially nocturia, for five years. Pyuria without bacteriuria on urinalysis. - Send urine sample for microscopic examination and culture. - Hold antibiotics until culture results. - Refer to follow-up with urology - overdue - Provide contact for Dr. Alfonzo and assist with scheduling if needed.  Orders: -     POCT URINALYSIS DIP (CLINITEK) -     Urinalysis, Routine w reflex microscopic -     Urine Culture -     Ambulatory referral to Urology  Return if symptoms worsen or fail to  improve.   ___________________________________________ Isaura Schiller de [place], MD, ABFM, [facility] Primary Care and Sports Medicine [facility]

## 2024-01-11 NOTE — Assessment & Plan Note (Signed)
 Increased urination frequency, especially nocturia, for five years. Pyuria without bacteriuria on urinalysis. - Send urine sample for microscopic examination and culture. - Hold antibiotics until culture results. - Refer to follow-up with urology - overdue - Provide contact for Dr. Alfonzo and assist with scheduling if needed.

## 2024-01-11 NOTE — Patient Instructions (Signed)
  Medication Instructions:  Your physician recommends that you continue on your current medications as directed. Please refer to the Current Medication list given to you today. --If you need a refill on any your medications before your next appointment, please call your pharmacy first. If no refills are authorized on file call the office.--  Referrals/Procedures/Imaging: Dr alfonzo -  [phone]  Follow-Up: Your next appointment:   Your physician recommends that you schedule a follow-up appointment in: as needed with Dr. de Peru  You will receive a text message or e-mail with a link to a survey about your care and experience with us  today! We would greatly appreciate your feedback!   Thanks for letting us  be apart of your health journey!!  Primary Care and Sports Medicine   Dr. Quintin sheerer Peru   We encourage you to activate your patient portal called MyChart.  Sign up information is provided on this After Visit Summary.  MyChart is used to connect with patients for Virtual Visits (Telemedicine).  Patients are able to view lab/test results, encounter notes, upcoming appointments, etc.  Non-urgent messages can be sent to your provider as well. To learn more about what you can do with MyChart, please visit --  [email or web address].

## 2024-01-12 ENCOUNTER — Ambulatory Visit (HOSPITAL_BASED_OUTPATIENT_CLINIC_OR_DEPARTMENT_OTHER): Payer: Self-pay | Admitting: Family Medicine

## 2024-01-12 LAB — URINE CULTURE

## 2024-01-12 LAB — URINALYSIS, ROUTINE W REFLEX MICROSCOPIC
Bilirubin, UA: NEGATIVE
Glucose, UA: NEGATIVE
Ketones, UA: NEGATIVE
Nitrite, UA: NEGATIVE
RBC, UA: NEGATIVE
Specific Gravity, UA: 1.012 (ref 1.005–1.030)
Urobilinogen, Ur: 0.2 mg/dL (ref 0.2–1.0)
pH, UA: 7.5 (ref 5.0–7.5)

## 2024-01-12 LAB — MICROSCOPIC EXAMINATION
Casts: NONE SEEN /LPF
WBC, UA: >30 /HPF — AB (ref 0–5)

## 2024-01-18 ENCOUNTER — Ambulatory Visit (HOSPITAL_BASED_OUTPATIENT_CLINIC_OR_DEPARTMENT_OTHER): Admitting: Family Medicine

## 2024-02-07 ENCOUNTER — Encounter: Payer: Self-pay | Admitting: Cardiology

## 2024-02-07 ENCOUNTER — Ambulatory Visit: Attending: Cardiology | Admitting: Cardiology

## 2024-02-07 VITALS — BP 175/102 | HR 65 | Ht 68.0 in | Wt 160.0 lb

## 2024-02-07 DIAGNOSIS — Z951 Presence of aortocoronary bypass graft: Secondary | ICD-10-CM | POA: Diagnosis not present

## 2024-02-07 DIAGNOSIS — I10 Essential (primary) hypertension: Secondary | ICD-10-CM | POA: Diagnosis not present

## 2024-02-07 DIAGNOSIS — E7800 Pure hypercholesterolemia, unspecified: Secondary | ICD-10-CM | POA: Diagnosis not present

## 2024-02-07 DIAGNOSIS — I2510 Atherosclerotic heart disease of native coronary artery without angina pectoris: Secondary | ICD-10-CM

## 2024-02-07 LAB — LIPID PANEL

## 2024-02-07 NOTE — Patient Instructions (Signed)
 Medication Instructions:  The current medical regimen is effective;  continue present plan and medications.  *If you need a refill on your cardiac medications before your next appointment, please call your pharmacy*  Lab Work: Please have blood work today (CBC, CMP and Lipid)  If you have labs (blood work) drawn today and your tests are completely normal, you will receive your results only by: MyChart Message (if you have MyChart) OR A paper copy in the mail If you have any lab test that is abnormal or we need to change your treatment, we will call you to review the results.  You have been referred to the Hypertension Clinic here with our pharmacy team.  Please keep a diary of your blood pressures to bring with you with you see them.  Follow-Up: At [facility], you and your health needs are our priority.  As part of our continuing mission to provide you with exceptional heart care, our providers are all part of one team.  This team includes your primary Cardiologist (physician) and Advanced Practice Providers or APPs (Physician Assistants and Nurse Practitioners) who all work together to provide you with the care you need, when you need it.  Your next appointment:   1 year(s)  Provider:   Oneil Parchment, MD    We recommend signing up for the patient portal called MyChart.  Sign up information is provided on this After Visit Summary.  MyChart is used to connect with patients for Virtual Visits (Telemedicine).  Patients are able to view lab/test results, encounter notes, upcoming appointments, etc.  Non-urgent messages can be sent to your provider as well.   To learn more about what you can do with MyChart, go to [email or web address].

## 2024-02-07 NOTE — Progress Notes (Signed)
 Cardiology Office Note:  .   Date:  02/07/2024  ID:  TRAYCEN GOYER, DOB 1945-07-09, MRN [id number] PCP: de Peru, Raymond J, MD   HeartCare Providers Cardiologist:  Oneil Parchment, MD     History of Present Illness: .   Roger [name] is a 78 y.o. male Discussed the use of AI scribe  History of Present Illness Roger [name] is a 78 year old male with coronary artery disease status post-CABG who presents for follow-up.  He has a history of coronary artery disease and underwent coronary artery bypass grafting (CABG). Since the surgery, he has been doing well, although he occasionally experiences 'little twinges' but denies severe pressure or difficulty breathing. He is currently on Zetia  10 mg and Crestor  40 mg for cholesterol management. Previous attempts to use Repatha  were unsuccessful due to cost. His last recorded LDL was 80 mg/dL.  He has a history of chronic kidney disease with a creatinine level of 1.26 mg/dL.  He has a history of paroxysmal atrial fibrillation post-bypass surgery. He had a brief episode off of amiodarone  but is currently doing well. His ejection fraction was 50% in 2021, and his EKG shows consistent T wave abnormalities.  He is on aspirin  81 mg and Toprol  50 mg daily. He does not currently monitor his blood pressure at home. He acknowledges having a home blood pressure monitor.  He mentions staying active but has not been fishing recently due to the heat and lack of time.      Studies Reviewed: SABRA   EKG Interpretation Date/Time:  Tuesday February 07 2024 10:32:52 EDT Ventricular Rate:  65 PR Interval:  118 QRS Duration:  82 QT Interval:  432 QTC Calculation: 449 R Axis:   24  Text Interpretation: Normal sinus rhythm Left ventricular hypertrophy with repolarization abnormality ( R in aVL , Romhilt-Estes ) When compared with ECG of 04-Feb-2023 09:14, No significant change since last tracing Confirmed by Parchment Oneil ([id number]) on 02/07/2024  10:56:45 AM    Results LABS Creatinine: 1.26 mg/dL LDL: 80 mg/dL  DIAGNOSTIC ECG: T wave abnormalities Risk Assessment/Calculations:           Physical Exam:   VS:  BP (!) 175/102   Pulse 65   Ht 5' 8 (1.727 m)   Wt 160 lb (72.6 kg)   SpO2 97%   BMI 24.33 kg/m    Wt Readings from Last 3 Encounters:  02/07/24 160 lb (72.6 kg)  01/11/24 160 lb 1.6 oz (72.6 kg)  12/06/23 159 lb 14.4 oz (72.5 kg)    GEN: Well nourished, well developed in no acute distress NECK: No JVD; No carotid bruits CARDIAC: RRR, no murmurs, no rubs, no gallops RESPIRATORY:  Clear to auscultation without rales, wheezing or rhonchi  ABDOMEN: Soft, non-tender, non-distended EXTREMITIES:  No edema; No deformity   ASSESSMENT AND PLAN: .    Assessment and Plan Assessment & Plan Coronary artery disease status post coronary artery bypass grafting (CABG) Status post CABG with well-managed condition. Reports occasional twinges but no severe pressure or dyspnea. Ejection fraction was 50% in 2021. EKG shows consistent T wave abnormalities. - Check blood work to update cholesterol and kidney function - Continue Aspirin  81 mg daily - Continue Zetia  10 mg daily - Continue Crestor  40 mg daily  Paroxysmal atrial fibrillation post bypass surgery Paroxysmal atrial fibrillation post CABG. Brief episode off amiodarone , currently doing well.  Hyperlipidemia Hyperlipidemia managed with Zetia  and Crestor . Previous attempt to use Repatha  was unsuccessful  due to cost. LDL was 80 at last check. - Check blood work to update cholesterol levels - Continue Zetia  10 mg daily - Continue Crestor  40 mg daily  Essential hypertension Blood pressure was elevated in office. No home monitoring currently being done. Potential for white coat hypertension discussed. - Instruct to monitor blood pressure at home and record readings - Refer to pharmacist for blood pressure management assistance  Chronic kidney disease stage 3a  (mild) Chronic kidney disease stage 2 with creatinine at 1.26, indicating mild renal impairment. - Check blood work to monitor kidney function         Dispo: 1 yr  Signed, Oneil Parchment, MD

## 2024-02-08 LAB — LIPID PANEL
Cholesterol, Total: 230 mg/dL — AB (ref 100–199)
HDL: 115 mg/dL (ref >39)
LDL CALC COMMENT:: 2 ratio (ref 0.0–5.0)
LDL Chol Calc (NIH): 104 mg/dL — AB (ref 0–99)
Triglycerides: 62 mg/dL (ref 0–149)
VLDL Cholesterol Cal: 11 mg/dL (ref 5–40)

## 2024-02-08 LAB — COMPREHENSIVE METABOLIC PANEL WITH GFR
ALT: 9 IU/L (ref 0–44)
AST: 20 IU/L (ref 0–40)
Albumin: 4.3 g/dL (ref 3.8–4.8)
Alkaline Phosphatase: 103 IU/L (ref 47–123)
BUN/Creatinine Ratio: 13 (ref 10–24)
BUN: 18 mg/dL (ref 8–27)
Bilirubin Total: 0.5 mg/dL (ref 0.0–1.2)
CO2: 22 mmol/L (ref 20–29)
Calcium: 9.4 mg/dL (ref 8.6–10.2)
Chloride: 101 mmol/L (ref 96–106)
Creatinine, Ser: 1.4 mg/dL — AB (ref 0.76–1.27)
Globulin, Total: 2.9 g/dL (ref 1.5–4.5)
Glucose: 82 mg/dL (ref 70–99)
Potassium: 4.4 mmol/L (ref 3.5–5.2)
Sodium: 139 mmol/L (ref 134–144)
Total Protein: 7.2 g/dL (ref 6.0–8.5)
eGFR: 52 mL/min/1.73 — AB (ref >59)

## 2024-02-08 LAB — CBC
Hematocrit: 37.1 % — ABNORMAL LOW (ref 37.5–51.0)
Hemoglobin: 12.1 g/dL — ABNORMAL LOW (ref 13.0–17.7)
MCH: 35.1 pg — ABNORMAL HIGH (ref 26.6–33.0)
MCHC: 32.6 g/dL (ref 31.5–35.7)
MCV: 108 fL — ABNORMAL HIGH (ref 79–97)
Platelets: 228 x10E3/uL (ref 150–450)
RBC: 3.45 x10E6/uL — ABNORMAL LOW (ref 4.14–5.80)
RDW: 12.7 % (ref 11.6–15.4)
WBC: 7.6 x10E3/uL (ref 3.4–10.8)

## 2024-02-10 ENCOUNTER — Encounter: Payer: Self-pay | Admitting: Cardiology

## 2024-02-10 ENCOUNTER — Other Ambulatory Visit (HOSPITAL_BASED_OUTPATIENT_CLINIC_OR_DEPARTMENT_OTHER): Payer: Self-pay

## 2024-02-10 ENCOUNTER — Ambulatory Visit: Payer: Self-pay | Admitting: Cardiology

## 2024-02-10 MED ORDER — COMIRNATY 30 MCG/0.3ML IM SUSY
0.3000 mL | PREFILLED_SYRINGE | Freq: Once | INTRAMUSCULAR | 0 refills | Status: AC
  Filled 2024-02-10: qty 0.3, 1d supply, fill #0

## 2024-02-28 ENCOUNTER — Encounter (HOSPITAL_BASED_OUTPATIENT_CLINIC_OR_DEPARTMENT_OTHER)

## 2024-03-06 ENCOUNTER — Other Ambulatory Visit: Payer: Self-pay | Admitting: Cardiology

## 2024-03-13 ENCOUNTER — Other Ambulatory Visit: Payer: Self-pay | Admitting: Cardiology

## 2024-03-15 NOTE — Progress Notes (Unsigned)
 Patient ID: Roger [name]                 DOB: 1945/06/23                      MRN: [id number]      HPI: Roger [name] is a 78 y.o. male referred by Dr. Jeffrie to HTN clinic. PMH is significant for CAD s/p CABG (2020), paroxysmal afib, HLD, CKD 3a, and HTN.  Patient was last seen in office by Dr. Jeffrie on 02/07/24. BP was 175/102 HR 65. Elevated BP in office was thought to be partly related to white coat syndrome. Past BP readings have also been very elevated in office At the time of the visit, the patient was on metoprolol  50 mg once daily for BP. During the visit, the patient was instructed to monitor his BP at home and bring in a BP log. Patient was referred to PharmD for hypertension management.  LDL was also up to 104 on 89/2/74 on rosuvastatin  40 mg once daily and ezetimibe  10 mg once daily. Patient was prescribed Repatha  in the past, but it appears that he was unable to afford $45/month for the Repatha . Will revisit Repatha  with the patient during PharmD visit to target goal LDL <55.  Patient presents to clinic today. Overall he is doing well. Patient did not bring his BP cuff or log, but did have a list of his medications and when he takes them. Patient takes his metoprolol  50 mg once daily at 5:30 pm. Patient stated that he takes his BP around once or twice daily and that they have been high. He said sometimes he gets SBP in the 140s, which he feel better at home but has seen some 180s. Patient stated that he does experience some chest pain when his BP is higher. This week, the patient took his BP on Sunday, Monday, and Thursday prior to coming in. BP in office today was 180/92 with HR of 66. Patient was educated and started on irbesartan for blood pressure during today's visit. We also discussed his LDL of 104, which is above the goal of LDL < 55, last month on rosuvastatin  and ezetimibe . Patient was hesitant to start an injectable medication, but was willing to try. Reviewed PCSK-9  inhibitors. Discussed mechanisms of action, dosing, side effects and potential decreases in LDL cholesterol.  Also reviewed cost information and potential options for patient assistance.    Current HTN meds: metoprolol  succinate 50 mg once daily Previously tried: lisinopril  2.5 mg and 5 mg daily (patient did not remembering taking these medications or experiencing adverse effects. Looks like they were discontinued around the time of his CABG). BP goal: <130/80  Family History:   Social History:  Tobacco: Never Alcohol: seldom  Diet:  Breakfast: Eggs, oatmeal Lunch: Hot dog Dinner: Spaghetti, soup Exercise:  Patient stated that he walks his dachshund, Roger [name], about 20 minutes per day  Wt Readings from Last 3 Encounters:  02/07/24 160 lb (72.6 kg)  01/11/24 160 lb 1.6 oz (72.6 kg)  12/06/23 159 lb 14.4 oz (72.5 kg)   BP Readings from Last 3 Encounters:  03/16/24 (!) 180/92  02/07/24 (!) 175/102  01/11/24 (!) 148/94   Pulse Readings from Last 3 Encounters:  03/16/24 66  02/07/24 65  01/11/24 75    Renal function: CrCl cannot be calculated (Patient's most recent lab result is older than the maximum 21 days allowed.).  Past Medical History:  Diagnosis Date  Chronic back pain    HLD (hyperlipidemia)    HTN (hypertension)     Current Outpatient Medications on File Prior to Visit  Medication Sig Dispense Refill   aspirin  EC 81 MG tablet Take 81 mg by mouth daily.     ezetimibe  (ZETIA ) 10 MG tablet TAKE 1 TABLET BY MOUTH DAILY 100 tablet 3   metoprolol  succinate (TOPROL -XL) 50 MG 24 hr tablet TAKE 1 TABLET BY MOUTH DAILY  WITH OR IMMEDIATELY FOLLOWING A  MEAL 100 tablet 2   Multiple Vitamin (MULTIVITAMIN WITH MINERALS) TABS tablet Take 1 tablet by mouth daily.     rosuvastatin  (CRESTOR ) 40 MG tablet TAKE 1 TABLET BY MOUTH DAILY AT  6 PM. 100 tablet 2   No current facility-administered medications on file prior to visit.    Allergies  Allergen Reactions    Hydrocodone -Acetaminophen  Itching   Temazepam Itching   Trazodone And Nefazodone Itching    Blood pressure (!) 180/92, pulse 66.   Assessment/Plan: HYPERTENSION CONTROL Vitals:   03/16/24 1013 03/16/24 1021  BP: (!) 180/90 (!) 180/92    The patient's blood pressure is elevated above target today.  In order to address the patient's elevated BP: A new medication was prescribed today.      1. Hypertension -  Primary hypertension Assessment: Patient's BP has been consistently above goal <130/80 in previous office visits on metoprolol  succinate 50 mg once daily BP readings at home have been elevated, but the patient cannot recall the exact numbers  Patient stated he experiences some chest pain when his BP is elevated Plan: Start irbesartan 150 mg once daily  Continue metoprolol  succinate 50 mg once daily Check BP at home at least once or twice daily and write them down on a log to bring to the next PharmD visit Recommend looking at nutrition labels are limiting salt in diet Continue to engage in physical activity targeting 150 minutes/week Follow-up labs at next primary care appointment in December Follow-up in office with PharmD on May 15, 2023  Hyperlipidemia Assessment: Last LDL was 104 on 02/07/24, which is above goal < 55 on rosuvastatin  40 mg and ezetimibe  10 mg Patient is tolerating rosuvastatin  and ezetimibe  well with no side effects Patient is willing to start Repatha  in addition to ezetimibe  and rosuvastatin  Plan: Will submit PA for Repatha  Continue rosuvastatin  40 mg daily and ezetimibe  10 mg once daily Recheck lipid panel in 3 months from start of Repatha     Thank you  Roger [name], PharmD PGY-1 Pharmacy Resident [facility] 03/16/2024 11:45 AM   Roger [name], Pharm.Roger [name], CPP [facility] A Division of  [facility] [address] [place], [place] [address]  Phone: [phone]; Fax: [phone]

## 2024-03-16 ENCOUNTER — Telehealth: Payer: Self-pay | Admitting: Pharmacy Technician

## 2024-03-16 ENCOUNTER — Other Ambulatory Visit (HOSPITAL_COMMUNITY): Payer: Self-pay

## 2024-03-16 ENCOUNTER — Ambulatory Visit: Attending: Cardiovascular Disease | Admitting: Pharmacist

## 2024-03-16 VITALS — BP 180/92 | HR 66

## 2024-03-16 DIAGNOSIS — E7800 Pure hypercholesterolemia, unspecified: Secondary | ICD-10-CM

## 2024-03-16 DIAGNOSIS — I10 Essential (primary) hypertension: Secondary | ICD-10-CM | POA: Diagnosis not present

## 2024-03-16 MED ORDER — IRBESARTAN 150 MG PO TABS
150.0000 mg | ORAL_TABLET | Freq: Every day | ORAL | 3 refills | Status: AC
  Filled 2024-03-16: qty 90, 90d supply, fill #0

## 2024-03-16 NOTE — Patient Instructions (Addendum)
 Your blood pressure goal is < 130/41mmHg  Start taking irbesartan 150 mg once daily  Continue taking metoprolol  succinate 50 mg once daily Get labs drawn at your primary care appointment in December Take blood pressure at least once daily and write down the numbers and time of day. Bring your blood pressure log and machine to your next appointment with the pharmacist on May 15, 2023.  Important lifestyle changes to control high blood pressure  Intervention  Effect on the BP   Weight loss Weight loss is one of the most effective lifestyle changes for controlling blood pressure. If you're overweight or obese, losing even a small amount of weight can help reduce blood pressure.    Blood pressure can decrease by 1 millimeter of mercury (mmHg) with each kilogram (about 2.2 pounds) of weight lost.   Exercise regularly As a general goal, aim for 30 minutes of moderate physical activity every day.    Regular physical activity can lower blood pressure by 5 - 8 mmHg.   Eat a healthy diet Eat a diet rich in whole grains, fruits, vegetables, lean meat, and low-fat dairy products. Limit processed foods, saturated fat, and sweets.    A heart-healthy diet can lower high blood pressure by 10 mmHg.   Reduce salt (sodium) in your diet Aim for 000mg  of sodium each day. Avoid deli meats, canned food, and frozen microwave meals which are high in sodium.     Limiting sodium can reduce blood pressure by 5 mmHg.   Limit alcohol One drink equals 12 ounces of beer, 5 ounces of wine, or 1.5 ounces of 80-proof liquor.    Limiting alcohol to < 1 drink a day for women or < 2 drinks a day for men can help lower blood pressure by about 4 mmHg.   To check your pressure at home you will need to:   Sit up in a chair, with feet flat on the floor and back supported. Do not cross your ankles or legs. Rest your left arm so that the cuff is about heart level. If the cuff goes on your upper arm, then just  relax your arm on the table, arm of the chair, or your lap. If you have a wrist cuff, hold your wrist against your chest at heart level. Place the cuff snugly around your arm, about 1 inch above the crease of your elbow. The cords should be inside the groove of your elbow.  Sit quietly, with the cuff in place, for about 5 minutes. Then press the power button to start a reading. Do not talk or move while the reading is taking place.  Record your readings on a sheet of paper. Although most cuffs have a memory, it is often easier to see a pattern developing when the numbers are all in front of you.  You can repeat the reading after 1-3 minutes if it is recommended.   Make sure your bladder is empty and you have not had caffeine or tobacco within the last 30 minutes   Always bring your blood pressure log with you to your appointments. If you have not brought your monitor in to be double checked for accuracy, please bring it to your next appointment.   You can find a list of validated (accurate) blood pressure cuffs at: [email or web address].no   Medication changes: Continue taking rosuvastatin  40 mg once daily and ezetimibe  10 mg once daily 2. We will start the process to get Repatha  covered by your insurance.  You will take Repatha  140 mg injected under the skin every 2 weeks.      Repatha  is a cholesterol medication that improved your body's ability to get rid of bad cholesterol known as LDL. It can lower your LDL up to 60%! It is an injection that is given under the skin every 2 weeks. The medication often requires a prior authorization from your insurance company. We will take care of submitting all the necessary information to your insurance company to get it approved. The most common side effects of Repatha  include runny nose, symptoms of the common cold, rarely flu or flu-like symptoms, back/muscle pain in about 3-4% of the patients, and redness, pain, or bruising at the injection site.   Lab  orders: We want to repeat labs after 2-3 months.  We will send you a lab order to remind you once we get closer to that time.

## 2024-03-16 NOTE — Telephone Encounter (Signed)
   Roger [name] approved in another encounter  Pharmacy Patient Advocate Encounter   Received notification from [employer] Messages that prior authorization for REPATHA  is required/requested.   Insurance verification completed.   The patient is insured through [employer].   Per test claim: PA required; PA submitted to above mentioned insurance via Latent Key/confirmation #/EOC [id number] Status is pending

## 2024-03-16 NOTE — Assessment & Plan Note (Addendum)
 Assessment: Last LDL was 104 on 02/07/24, which is above goal < 55 on rosuvastatin  40 mg and ezetimibe  10 mg Patient is tolerating rosuvastatin  and ezetimibe  well with no side effects Patient is willing to start Repatha  in addition to ezetimibe  and rosuvastatin  Plan: Will submit PA for Repatha  Continue rosuvastatin  40 mg daily and ezetimibe  10 mg once daily Recheck lipid panel in 3 months from start of Repatha 

## 2024-03-16 NOTE — Telephone Encounter (Signed)
 Pharmacy Patient Advocate Encounter  Received notification from [facility] that Prior Authorization for repatha  has been APPROVED from 03/16/24 to 05/02/25   PA #/Case ID/Reference #: [id number]       In wam

## 2024-03-16 NOTE — Assessment & Plan Note (Signed)
 Assessment: Patient's BP has been consistently above goal <130/80 in previous office visits on metoprolol  succinate 50 mg once daily BP readings at home have been elevated, but the patient cannot recall the exact numbers  Patient stated he experiences some chest pain when his BP is elevated Plan: Start irbesartan 150 mg once daily  Continue metoprolol  succinate 50 mg once daily Check BP at home at least once or twice daily and write them down on a log to bring to the next PharmD visit Recommend looking at nutrition labels are limiting salt in diet Continue to engage in physical activity targeting 150 minutes/week Follow-up labs at next primary care appointment in December Follow-up in office with PharmD on May 15, 2023

## 2024-03-16 NOTE — Telephone Encounter (Signed)
 Patient Advocate Encounter   The patient was approved for a [employer] grant that will help cover the cost of Repatha  Total amount awarded, 2500.00.  Effective: 02/15/24 - 02/13/25   APW:[id number] ERW:[id number] Hmnle:[id number] PI:[id number]  [employer] ID: [id number]   Pharmacy provided with approval and processing information. Patient informed via my chart

## 2024-03-19 ENCOUNTER — Encounter: Payer: Self-pay | Admitting: Pharmacist

## 2024-03-19 ENCOUNTER — Other Ambulatory Visit (HOSPITAL_BASED_OUTPATIENT_CLINIC_OR_DEPARTMENT_OTHER): Payer: Self-pay

## 2024-03-19 MED ORDER — REPATHA SURECLICK 140 MG/ML ~~LOC~~ SOAJ
1.0000 mL | SUBCUTANEOUS | 11 refills | Status: DC
  Filled 2024-03-19: qty 2, 28d supply, fill #0

## 2024-03-19 NOTE — Telephone Encounter (Signed)
 Called pt to let him know Repatha  was approved and rx and grant was send to [employer]. No answer and no VM. Will try again later

## 2024-03-19 NOTE — Addendum Note (Signed)
 Addended by: Susy Placzek D on: 03/19/2024 09:19 AM   Modules accepted: Orders

## 2024-03-20 ENCOUNTER — Other Ambulatory Visit (HOSPITAL_COMMUNITY): Payer: Self-pay

## 2024-03-20 ENCOUNTER — Telehealth: Payer: Self-pay | Admitting: Pharmacy Technician

## 2024-03-20 NOTE — Telephone Encounter (Signed)
 Pharmacy Patient Advocate Encounter  Received notification from [facility] that Prior Authorization for nexlizet has been APPROVED from 03/20/24 to 09/17/24. Ran test claim, Copay is $47.00- one month. This test claim was processed through [facility]- copay amounts may vary at other pharmacies due to pharmacy/plan contracts, or as the patient moves through the different stages of their insurance plan.   PA #/Case ID/Reference #: [id number]

## 2024-03-20 NOTE — Telephone Encounter (Signed)
 Pharmacy Patient Advocate Encounter   Received notification from [facility]ician[facility] Office that prior authorization for nexlizet is required/requested.   Insurance verification completed.   The patient is insured through [employer].   Per test claim: PA required; PA submitted to above mentioned insurance via Latent Key/confirmation #/EOC [id number] Status is pending

## 2024-03-21 ENCOUNTER — Other Ambulatory Visit: Payer: Self-pay

## 2024-03-21 ENCOUNTER — Other Ambulatory Visit (HOSPITAL_BASED_OUTPATIENT_CLINIC_OR_DEPARTMENT_OTHER): Payer: Self-pay

## 2024-03-21 MED ORDER — NEXLIZET 180-10 MG PO TABS
1.0000 | ORAL_TABLET | Freq: Every day | ORAL | 3 refills | Status: AC
  Filled 2024-03-21: qty 90, 90d supply, fill #0
  Filled 2024-06-06: qty 90, 90d supply, fill #1

## 2024-03-21 NOTE — Addendum Note (Signed)
 Addended by: Santos Hardwick D on: 03/21/2024 12:17 PM   Modules accepted: Orders

## 2024-03-22 ENCOUNTER — Other Ambulatory Visit (HOSPITAL_BASED_OUTPATIENT_CLINIC_OR_DEPARTMENT_OTHER): Payer: Self-pay

## 2024-04-06 ENCOUNTER — Ambulatory Visit (HOSPITAL_BASED_OUTPATIENT_CLINIC_OR_DEPARTMENT_OTHER): Admitting: Family Medicine

## 2024-05-14 ENCOUNTER — Ambulatory Visit: Admitting: Pharmacist

## 2024-05-30 ENCOUNTER — Other Ambulatory Visit (HOSPITAL_COMMUNITY): Payer: Self-pay

## 2024-05-31 ENCOUNTER — Encounter: Payer: Self-pay | Admitting: Cardiology

## 2024-06-05 ENCOUNTER — Encounter: Payer: Self-pay | Admitting: Cardiology

## 2024-06-06 ENCOUNTER — Other Ambulatory Visit (HOSPITAL_BASED_OUTPATIENT_CLINIC_OR_DEPARTMENT_OTHER): Payer: Self-pay

## 2024-06-19 ENCOUNTER — Ambulatory Visit (HOSPITAL_BASED_OUTPATIENT_CLINIC_OR_DEPARTMENT_OTHER)
